# Patient Record
Sex: Female | Born: 1945 | Hispanic: No | Marital: Married | Smoking: Current some day smoker
Health system: Southern US, Community
[De-identification: ages and names within clinical notes are randomized; demographics above are authoritative.]

## PROBLEM LIST (undated history)

## (undated) DIAGNOSIS — E785 Hyperlipidemia, unspecified: Secondary | ICD-10-CM

## (undated) DIAGNOSIS — Z86010 Personal history of colonic polyps: Secondary | ICD-10-CM

## (undated) DIAGNOSIS — Z853 Personal history of malignant neoplasm of breast: Secondary | ICD-10-CM

## (undated) DIAGNOSIS — IMO0002 Reserved for concepts with insufficient information to code with codable children: Secondary | ICD-10-CM

## (undated) DIAGNOSIS — D4959 Neoplasm of unspecified behavior of other genitourinary organ: Secondary | ICD-10-CM

## (undated) DIAGNOSIS — I10 Essential (primary) hypertension: Secondary | ICD-10-CM

## (undated) DIAGNOSIS — H9190 Unspecified hearing loss, unspecified ear: Secondary | ICD-10-CM

## (undated) DIAGNOSIS — M1990 Unspecified osteoarthritis, unspecified site: Secondary | ICD-10-CM

## (undated) DIAGNOSIS — N289 Disorder of kidney and ureter, unspecified: Secondary | ICD-10-CM

## (undated) DIAGNOSIS — F319 Bipolar disorder, unspecified: Secondary | ICD-10-CM

## (undated) HISTORY — PX: BREAST LUMPECTOMY: SHX2

## (undated) HISTORY — DX: Reserved for concepts with insufficient information to code with codable children: IMO0002

## (undated) HISTORY — PX: REPLACEMENT TOTAL KNEE: SUR1224

## (undated) HISTORY — DX: Bipolar disorder, unspecified: F31.9

## (undated) HISTORY — DX: Essential (primary) hypertension: I10

## (undated) HISTORY — DX: Personal history of colonic polyps: Z86.010

## (undated) HISTORY — DX: Neoplasm of unspecified behavior of other genitourinary organ: D49.59

## (undated) HISTORY — DX: Unspecified hearing loss, unspecified ear: H91.90

## (undated) HISTORY — DX: Disorder of kidney and ureter, unspecified: N28.9

## (undated) HISTORY — DX: Unspecified osteoarthritis, unspecified site: M19.90

## (undated) HISTORY — DX: Hyperlipidemia, unspecified: E78.5

## (undated) HISTORY — DX: Personal history of malignant neoplasm of breast: Z85.3

---

## 2000-12-16 HISTORY — PX: UMBILICAL HERNIA REPAIR: SHX196

## 2004-05-16 HISTORY — PX: KNEE ARTHROSCOPY: SUR90

## 2004-08-16 HISTORY — PX: PELVIC FRACTURE SURGERY: SHX119

## 2004-09-25 ENCOUNTER — Ambulatory Visit: Payer: Self-pay

## 2004-10-16 DIAGNOSIS — D4959 Neoplasm of unspecified behavior of other genitourinary organ: Secondary | ICD-10-CM

## 2004-10-16 HISTORY — DX: Neoplasm of unspecified behavior of other genitourinary organ: D49.59

## 2004-11-02 ENCOUNTER — Other Ambulatory Visit: Payer: Self-pay

## 2004-11-09 ENCOUNTER — Ambulatory Visit: Payer: Self-pay | Admitting: Obstetrics & Gynecology

## 2004-12-05 ENCOUNTER — Ambulatory Visit: Payer: Self-pay | Admitting: Gynecologic Oncology

## 2005-05-01 ENCOUNTER — Ambulatory Visit: Payer: Self-pay | Admitting: Pain Medicine

## 2005-05-20 ENCOUNTER — Ambulatory Visit: Payer: Self-pay | Admitting: Pain Medicine

## 2005-05-22 ENCOUNTER — Ambulatory Visit: Payer: Self-pay | Admitting: Pain Medicine

## 2005-08-01 ENCOUNTER — Other Ambulatory Visit: Payer: Self-pay

## 2005-09-13 ENCOUNTER — Ambulatory Visit: Payer: Self-pay | Admitting: Cardiology

## 2005-09-20 ENCOUNTER — Ambulatory Visit: Payer: Self-pay | Admitting: Surgery

## 2005-10-03 ENCOUNTER — Ambulatory Visit: Payer: Self-pay | Admitting: Oncology

## 2005-10-17 ENCOUNTER — Ambulatory Visit: Payer: Self-pay | Admitting: Oncology

## 2005-11-15 ENCOUNTER — Ambulatory Visit: Payer: Self-pay | Admitting: Oncology

## 2005-12-16 ENCOUNTER — Ambulatory Visit: Payer: Self-pay | Admitting: Oncology

## 2006-01-16 ENCOUNTER — Ambulatory Visit: Payer: Self-pay | Admitting: Oncology

## 2006-04-15 ENCOUNTER — Ambulatory Visit: Payer: Self-pay | Admitting: Oncology

## 2006-05-16 ENCOUNTER — Ambulatory Visit: Payer: Self-pay | Admitting: Oncology

## 2006-06-20 ENCOUNTER — Ambulatory Visit: Payer: Self-pay | Admitting: Gastroenterology

## 2006-07-10 ENCOUNTER — Ambulatory Visit: Payer: Self-pay | Admitting: Internal Medicine

## 2006-07-16 ENCOUNTER — Ambulatory Visit: Payer: Self-pay | Admitting: Oncology

## 2006-08-11 ENCOUNTER — Encounter: Admission: RE | Admit: 2006-08-11 | Discharge: 2006-08-11 | Payer: Self-pay | Admitting: Neurosurgery

## 2006-08-16 ENCOUNTER — Ambulatory Visit: Payer: Self-pay | Admitting: Oncology

## 2006-09-09 ENCOUNTER — Ambulatory Visit: Payer: Self-pay | Admitting: Internal Medicine

## 2006-09-24 ENCOUNTER — Ambulatory Visit: Payer: Self-pay | Admitting: Internal Medicine

## 2006-10-28 ENCOUNTER — Ambulatory Visit: Payer: Self-pay | Admitting: Internal Medicine

## 2006-11-12 ENCOUNTER — Ambulatory Visit: Payer: Self-pay | Admitting: Oncology

## 2006-11-15 ENCOUNTER — Ambulatory Visit: Payer: Self-pay | Admitting: Oncology

## 2007-01-13 ENCOUNTER — Ambulatory Visit: Payer: Self-pay | Admitting: Internal Medicine

## 2007-01-20 ENCOUNTER — Ambulatory Visit: Payer: Self-pay | Admitting: Internal Medicine

## 2007-03-23 ENCOUNTER — Ambulatory Visit: Payer: Self-pay | Admitting: Oncology

## 2007-04-16 ENCOUNTER — Ambulatory Visit: Payer: Self-pay | Admitting: Oncology

## 2007-04-20 ENCOUNTER — Ambulatory Visit: Payer: Self-pay | Admitting: Internal Medicine

## 2007-04-20 DIAGNOSIS — E785 Hyperlipidemia, unspecified: Secondary | ICD-10-CM | POA: Insufficient documentation

## 2007-04-20 DIAGNOSIS — Z853 Personal history of malignant neoplasm of breast: Secondary | ICD-10-CM | POA: Insufficient documentation

## 2007-04-20 DIAGNOSIS — M5137 Other intervertebral disc degeneration, lumbosacral region: Secondary | ICD-10-CM | POA: Insufficient documentation

## 2007-04-20 DIAGNOSIS — M1990 Unspecified osteoarthritis, unspecified site: Secondary | ICD-10-CM | POA: Insufficient documentation

## 2007-04-20 DIAGNOSIS — I10 Essential (primary) hypertension: Secondary | ICD-10-CM | POA: Insufficient documentation

## 2007-04-20 DIAGNOSIS — Z860100 Personal history of colon polyps, unspecified: Secondary | ICD-10-CM | POA: Insufficient documentation

## 2007-04-20 DIAGNOSIS — N259 Disorder resulting from impaired renal tubular function, unspecified: Secondary | ICD-10-CM | POA: Insufficient documentation

## 2007-04-30 ENCOUNTER — Other Ambulatory Visit: Payer: Self-pay

## 2007-04-30 ENCOUNTER — Emergency Department: Payer: Self-pay | Admitting: Emergency Medicine

## 2007-05-03 ENCOUNTER — Emergency Department: Payer: Self-pay | Admitting: Emergency Medicine

## 2007-05-03 ENCOUNTER — Encounter: Payer: Self-pay | Admitting: Internal Medicine

## 2007-05-06 ENCOUNTER — Encounter: Payer: Self-pay | Admitting: Internal Medicine

## 2007-05-22 ENCOUNTER — Ambulatory Visit: Payer: Self-pay | Admitting: Internal Medicine

## 2007-05-22 LAB — CONVERTED CEMR LAB: INR: 2.1

## 2007-05-25 ENCOUNTER — Ambulatory Visit: Payer: Self-pay | Admitting: Internal Medicine

## 2007-06-01 ENCOUNTER — Ambulatory Visit: Payer: Self-pay | Admitting: Internal Medicine

## 2007-06-01 LAB — CONVERTED CEMR LAB: INR: 1.8

## 2007-06-15 ENCOUNTER — Ambulatory Visit: Payer: Self-pay | Admitting: Internal Medicine

## 2007-06-15 DIAGNOSIS — F319 Bipolar disorder, unspecified: Secondary | ICD-10-CM | POA: Insufficient documentation

## 2007-06-15 LAB — CONVERTED CEMR LAB
CO2: 34 meq/L — ABNORMAL HIGH (ref 19–32)
Glucose, Bld: 106 mg/dL — ABNORMAL HIGH (ref 70–99)
INR: 1.7
Phosphorus: 4.3 mg/dL (ref 2.3–4.6)
Potassium: 4.7 meq/L (ref 3.5–5.1)
Prothrombin Time: 16 s
Sodium: 146 meq/L — ABNORMAL HIGH (ref 135–145)

## 2007-06-29 ENCOUNTER — Ambulatory Visit: Payer: Self-pay | Admitting: Internal Medicine

## 2007-07-30 ENCOUNTER — Encounter (INDEPENDENT_AMBULATORY_CARE_PROVIDER_SITE_OTHER): Payer: Self-pay | Admitting: *Deleted

## 2007-08-03 ENCOUNTER — Ambulatory Visit: Payer: Self-pay | Admitting: Internal Medicine

## 2007-08-10 ENCOUNTER — Ambulatory Visit: Payer: Self-pay | Admitting: Internal Medicine

## 2007-08-19 ENCOUNTER — Ambulatory Visit: Payer: Self-pay | Admitting: Internal Medicine

## 2007-08-19 LAB — CONVERTED CEMR LAB
INR: 1.8
Prothrombin Time: 16.7 s

## 2007-09-09 ENCOUNTER — Telehealth: Payer: Self-pay | Admitting: Internal Medicine

## 2007-09-09 ENCOUNTER — Ambulatory Visit: Payer: Self-pay | Admitting: Internal Medicine

## 2007-09-09 LAB — CONVERTED CEMR LAB
INR: 2.8
Prothrombin Time: 20.4 s

## 2007-10-07 ENCOUNTER — Ambulatory Visit: Payer: Self-pay | Admitting: Internal Medicine

## 2007-10-07 LAB — CONVERTED CEMR LAB: Prothrombin Time: 19.9 s

## 2007-10-17 ENCOUNTER — Ambulatory Visit: Payer: Self-pay | Admitting: Oncology

## 2007-10-19 ENCOUNTER — Encounter: Payer: Self-pay | Admitting: Internal Medicine

## 2007-10-19 ENCOUNTER — Ambulatory Visit: Payer: Self-pay | Admitting: Oncology

## 2007-11-04 ENCOUNTER — Ambulatory Visit: Payer: Self-pay | Admitting: Internal Medicine

## 2007-11-04 LAB — CONVERTED CEMR LAB

## 2007-11-16 ENCOUNTER — Ambulatory Visit: Payer: Self-pay | Admitting: Oncology

## 2007-11-16 ENCOUNTER — Ambulatory Visit: Payer: Self-pay | Admitting: Internal Medicine

## 2007-11-17 LAB — CONVERTED CEMR LAB
CO2: 30 meq/L (ref 19–32)
Calcium: 9.8 mg/dL (ref 8.4–10.5)
Chloride: 107 meq/L (ref 96–112)
Chloride: 107 meq/L (ref 96–112)
Creatinine, Ser: 2 mg/dL — ABNORMAL HIGH (ref 0.4–1.2)
Creatinine, Ser: 2.1 mg/dL — ABNORMAL HIGH (ref 0.4–1.2)
GFR calc Af Amer: 33 mL/min
Glucose, Bld: 85 mg/dL (ref 70–99)
Glucose, Bld: 75 mg/dL (ref 70–99)
Phosphorus: 4.6 mg/dL (ref 2.3–4.6)
Sodium: 143 meq/L (ref 135–145)

## 2007-12-21 ENCOUNTER — Telehealth: Payer: Self-pay | Admitting: Internal Medicine

## 2008-02-14 ENCOUNTER — Ambulatory Visit: Payer: Self-pay | Admitting: Oncology

## 2008-03-15 ENCOUNTER — Ambulatory Visit: Payer: Self-pay | Admitting: Internal Medicine

## 2008-03-15 DIAGNOSIS — R32 Unspecified urinary incontinence: Secondary | ICD-10-CM | POA: Insufficient documentation

## 2008-03-16 LAB — CONVERTED CEMR LAB
Albumin: 3 g/dL — ABNORMAL LOW (ref 3.5–5.2)
Basophils Absolute: 0 10*3/uL (ref 0.0–0.1)
Basophils Relative: 0.4 % (ref 0.0–1.0)
CO2: 30 meq/L (ref 19–32)
Creatinine, Ser: 2.1 mg/dL — ABNORMAL HIGH (ref 0.4–1.2)
Glucose, Bld: 105 mg/dL — ABNORMAL HIGH (ref 70–99)
Hemoglobin: 12.8 g/dL (ref 12.0–15.0)
Lymphocytes Relative: 17.9 % (ref 12.0–46.0)
MCHC: 32.8 g/dL (ref 30.0–36.0)
Neutro Abs: 3.9 10*3/uL (ref 1.4–7.7)
RBC: 4.14 M/uL (ref 3.87–5.11)

## 2008-04-01 ENCOUNTER — Telehealth: Payer: Self-pay | Admitting: Internal Medicine

## 2008-04-12 ENCOUNTER — Encounter: Payer: Self-pay | Admitting: Internal Medicine

## 2008-04-13 ENCOUNTER — Encounter: Payer: Self-pay | Admitting: Internal Medicine

## 2008-04-15 ENCOUNTER — Ambulatory Visit: Payer: Self-pay | Admitting: Oncology

## 2008-04-20 ENCOUNTER — Ambulatory Visit: Payer: Self-pay | Admitting: Oncology

## 2008-05-16 ENCOUNTER — Ambulatory Visit: Payer: Self-pay | Admitting: Oncology

## 2008-05-30 ENCOUNTER — Telehealth: Payer: Self-pay | Admitting: Internal Medicine

## 2008-06-06 ENCOUNTER — Ambulatory Visit: Payer: Self-pay | Admitting: General Practice

## 2008-06-15 ENCOUNTER — Inpatient Hospital Stay: Payer: Self-pay | Admitting: General Practice

## 2008-06-21 ENCOUNTER — Encounter: Payer: Self-pay | Admitting: Internal Medicine

## 2008-07-06 ENCOUNTER — Ambulatory Visit: Payer: Self-pay | Admitting: Internal Medicine

## 2008-07-07 LAB — CONVERTED CEMR LAB
Albumin: 2.7 g/dL — ABNORMAL LOW (ref 3.5–5.2)
Basophils Absolute: 0 10*3/uL (ref 0.0–0.1)
Basophils Relative: 0.1 % (ref 0.0–3.0)
Creatinine, Ser: 2.5 mg/dL — ABNORMAL HIGH (ref 0.4–1.2)
Eosinophils Absolute: 0 10*3/uL (ref 0.0–0.7)
GFR calc Af Amer: 25 mL/min
HCT: 29 % — ABNORMAL LOW (ref 36.0–46.0)
Hemoglobin: 9.7 g/dL — ABNORMAL LOW (ref 12.0–15.0)
MCHC: 33.5 g/dL (ref 30.0–36.0)
MCV: 96 fL (ref 78.0–100.0)
Neutro Abs: 9.1 10*3/uL — ABNORMAL HIGH (ref 1.4–7.7)
Phosphorus: 4.3 mg/dL (ref 2.3–4.6)
RBC: 3.02 M/uL — ABNORMAL LOW (ref 3.87–5.11)

## 2008-08-31 ENCOUNTER — Ambulatory Visit: Payer: Self-pay | Admitting: Internal Medicine

## 2008-09-01 LAB — CONVERTED CEMR LAB
Basophils Absolute: 0 10*3/uL (ref 0.0–0.1)
Basophils Relative: 0.1 % (ref 0.0–3.0)
Calcium: 9.5 mg/dL (ref 8.4–10.5)
Chloride: 112 meq/L (ref 96–112)
Creatinine, Ser: 2.3 mg/dL — ABNORMAL HIGH (ref 0.4–1.2)
Eosinophils Absolute: 0 10*3/uL (ref 0.0–0.7)
GFR calc Af Amer: 28 mL/min
MCHC: 33.1 g/dL (ref 30.0–36.0)
MCV: 96.2 fL (ref 78.0–100.0)
Monocytes Absolute: 0.7 10*3/uL (ref 0.1–1.0)
Neutrophils Relative %: 75.1 % (ref 43.0–77.0)
Phosphorus: 3.1 mg/dL (ref 2.3–4.6)
Platelets: 227 10*3/uL (ref 150–400)
RBC: 3.39 M/uL — ABNORMAL LOW (ref 3.87–5.11)

## 2008-10-16 ENCOUNTER — Ambulatory Visit: Payer: Self-pay | Admitting: Oncology

## 2008-10-19 ENCOUNTER — Encounter: Payer: Self-pay | Admitting: Internal Medicine

## 2008-10-24 ENCOUNTER — Ambulatory Visit: Payer: Self-pay | Admitting: Oncology

## 2008-10-25 ENCOUNTER — Encounter: Payer: Self-pay | Admitting: Internal Medicine

## 2008-11-15 ENCOUNTER — Ambulatory Visit: Payer: Self-pay | Admitting: Oncology

## 2009-01-11 ENCOUNTER — Ambulatory Visit: Payer: Self-pay | Admitting: Internal Medicine

## 2009-01-11 DIAGNOSIS — L570 Actinic keratosis: Secondary | ICD-10-CM

## 2009-01-12 LAB — CONVERTED CEMR LAB
AST: 15 units/L (ref 0–37)
Alkaline Phosphatase: 81 units/L (ref 39–117)
Basophils Absolute: 0 10*3/uL (ref 0.0–0.1)
Basophils Relative: 0.4 % (ref 0.0–3.0)
Bilirubin, Direct: 0.1 mg/dL (ref 0.0–0.3)
CO2: 26 meq/L (ref 19–32)
Calcium: 9.4 mg/dL (ref 8.4–10.5)
Chloride: 113 meq/L — ABNORMAL HIGH (ref 96–112)
Creatinine, Ser: 2 mg/dL — ABNORMAL HIGH (ref 0.4–1.2)
Eosinophils Absolute: 0.1 10*3/uL (ref 0.0–0.7)
Glucose, Bld: 81 mg/dL (ref 70–99)
Lymphocytes Relative: 18.2 % (ref 12.0–46.0)
MCHC: 34.5 g/dL (ref 30.0–36.0)
Neutrophils Relative %: 68 % (ref 43.0–77.0)
Platelets: 143 10*3/uL — ABNORMAL LOW (ref 150–400)
RBC: 3.58 M/uL — ABNORMAL LOW (ref 3.87–5.11)
RDW: 14.8 % — ABNORMAL HIGH (ref 11.5–14.6)
Total Bilirubin: 0.5 mg/dL (ref 0.3–1.2)

## 2009-01-26 ENCOUNTER — Ambulatory Visit: Payer: Self-pay | Admitting: Internal Medicine

## 2009-03-01 ENCOUNTER — Telehealth: Payer: Self-pay | Admitting: Internal Medicine

## 2009-03-16 ENCOUNTER — Ambulatory Visit: Payer: Self-pay | Admitting: Oncology

## 2009-03-22 ENCOUNTER — Ambulatory Visit: Payer: Self-pay | Admitting: Internal Medicine

## 2009-04-11 ENCOUNTER — Encounter: Payer: Self-pay | Admitting: Internal Medicine

## 2009-04-11 ENCOUNTER — Ambulatory Visit: Payer: Self-pay | Admitting: Oncology

## 2009-04-15 ENCOUNTER — Ambulatory Visit: Payer: Self-pay | Admitting: Oncology

## 2009-07-13 ENCOUNTER — Telehealth (INDEPENDENT_AMBULATORY_CARE_PROVIDER_SITE_OTHER): Payer: Self-pay | Admitting: *Deleted

## 2009-07-19 ENCOUNTER — Ambulatory Visit: Payer: Self-pay | Admitting: Internal Medicine

## 2009-07-21 LAB — CONVERTED CEMR LAB
Albumin: 2.9 g/dL — ABNORMAL LOW (ref 3.5–5.2)
BUN: 26 mg/dL — ABNORMAL HIGH (ref 6–23)
CO2: 30 meq/L (ref 19–32)
Chloride: 110 meq/L (ref 96–112)
Eosinophils Relative: 1 % (ref 0.0–5.0)
HCT: 34.1 % — ABNORMAL LOW (ref 36.0–46.0)
Hemoglobin: 11.7 g/dL — ABNORMAL LOW (ref 12.0–15.0)
Lymphs Abs: 1 10*3/uL (ref 0.7–4.0)
MCV: 94.9 fL (ref 78.0–100.0)
Monocytes Absolute: 0.7 10*3/uL (ref 0.1–1.0)
Monocytes Relative: 12.2 % — ABNORMAL HIGH (ref 3.0–12.0)
Neutro Abs: 3.9 10*3/uL (ref 1.4–7.7)
Phosphorus: 4.2 mg/dL (ref 2.3–4.6)
Platelets: 167 10*3/uL (ref 150.0–400.0)
Potassium: 4.8 meq/L (ref 3.5–5.1)
RDW: 15.3 % — ABNORMAL HIGH (ref 11.5–14.6)
WBC: 5.7 10*3/uL (ref 4.5–10.5)

## 2009-09-01 ENCOUNTER — Encounter: Payer: Self-pay | Admitting: Internal Medicine

## 2009-09-11 ENCOUNTER — Encounter: Payer: Self-pay | Admitting: Internal Medicine

## 2009-09-11 ENCOUNTER — Ambulatory Visit: Payer: Self-pay | Admitting: Gastroenterology

## 2009-09-15 ENCOUNTER — Ambulatory Visit: Payer: Self-pay | Admitting: Oncology

## 2009-09-26 ENCOUNTER — Ambulatory Visit: Payer: Self-pay | Admitting: Oncology

## 2009-10-16 ENCOUNTER — Ambulatory Visit: Payer: Self-pay | Admitting: Oncology

## 2010-01-04 ENCOUNTER — Ambulatory Visit: Payer: Self-pay | Admitting: Internal Medicine

## 2010-01-17 ENCOUNTER — Ambulatory Visit: Payer: Self-pay | Admitting: Internal Medicine

## 2010-01-18 LAB — CONVERTED CEMR LAB
AST: 19 units/L (ref 0–37)
Alkaline Phosphatase: 76 units/L (ref 39–117)
BUN: 30 mg/dL — ABNORMAL HIGH (ref 6–23)
Basophils Absolute: 0 10*3/uL (ref 0.0–0.1)
Bilirubin, Direct: 0.1 mg/dL (ref 0.0–0.3)
CO2: 28 meq/L (ref 19–32)
Calcium: 9.4 mg/dL (ref 8.4–10.5)
Cholesterol: 210 mg/dL — ABNORMAL HIGH (ref 0–200)
Creatinine, Ser: 2.1 mg/dL — ABNORMAL HIGH (ref 0.4–1.2)
Eosinophils Absolute: 0 10*3/uL (ref 0.0–0.7)
Glucose, Bld: 103 mg/dL — ABNORMAL HIGH (ref 70–99)
HCT: 35.1 % — ABNORMAL LOW (ref 36.0–46.0)
Lymphs Abs: 1.2 10*3/uL (ref 0.7–4.0)
MCHC: 34 g/dL (ref 30.0–36.0)
MCV: 100 fL (ref 78.0–100.0)
Monocytes Absolute: 0.7 10*3/uL (ref 0.1–1.0)
Neutrophils Relative %: 68 % (ref 43.0–77.0)
Platelets: 154 10*3/uL (ref 150.0–400.0)
RDW: 14.1 % (ref 11.5–14.6)
Sodium: 141 meq/L (ref 135–145)
TSH: 1.15 microintl units/mL (ref 0.35–5.50)
Total Protein: 6 g/dL (ref 6.0–8.3)
VLDL: 32 mg/dL (ref 0.0–40.0)

## 2010-01-23 ENCOUNTER — Ambulatory Visit: Payer: Self-pay | Admitting: Internal Medicine

## 2010-01-23 ENCOUNTER — Encounter: Payer: Self-pay | Admitting: Internal Medicine

## 2010-02-01 ENCOUNTER — Ambulatory Visit: Payer: Self-pay | Admitting: Family Medicine

## 2010-02-02 ENCOUNTER — Encounter: Payer: Self-pay | Admitting: Family Medicine

## 2010-03-13 ENCOUNTER — Encounter: Payer: Self-pay | Admitting: Specialist

## 2010-03-16 ENCOUNTER — Ambulatory Visit: Payer: Self-pay | Admitting: Oncology

## 2010-03-16 ENCOUNTER — Encounter: Payer: Self-pay | Admitting: Specialist

## 2010-03-27 ENCOUNTER — Encounter: Payer: Self-pay | Admitting: Internal Medicine

## 2010-03-27 ENCOUNTER — Ambulatory Visit: Payer: Self-pay | Admitting: Oncology

## 2010-04-15 ENCOUNTER — Ambulatory Visit: Payer: Self-pay | Admitting: Oncology

## 2010-08-01 ENCOUNTER — Ambulatory Visit: Payer: Self-pay | Admitting: Internal Medicine

## 2010-08-01 DIAGNOSIS — F17200 Nicotine dependence, unspecified, uncomplicated: Secondary | ICD-10-CM

## 2010-08-01 DIAGNOSIS — R109 Unspecified abdominal pain: Secondary | ICD-10-CM | POA: Insufficient documentation

## 2010-08-03 LAB — CONVERTED CEMR LAB
Albumin: 3.1 g/dL — ABNORMAL LOW (ref 3.5–5.2)
BUN: 34 mg/dL — ABNORMAL HIGH (ref 6–23)
Chloride: 105 meq/L (ref 96–112)
Creatinine, Ser: 2.6 mg/dL — ABNORMAL HIGH (ref 0.4–1.2)
Phosphorus: 4.1 mg/dL (ref 2.3–4.6)

## 2010-08-08 ENCOUNTER — Encounter: Payer: Self-pay | Admitting: Internal Medicine

## 2010-08-29 ENCOUNTER — Encounter: Payer: Self-pay | Admitting: Internal Medicine

## 2010-08-29 ENCOUNTER — Ambulatory Visit: Payer: Self-pay | Admitting: Surgery

## 2010-08-30 ENCOUNTER — Telehealth: Payer: Self-pay | Admitting: Internal Medicine

## 2010-09-03 ENCOUNTER — Encounter: Payer: Self-pay | Admitting: Internal Medicine

## 2010-09-05 ENCOUNTER — Ambulatory Visit: Payer: Self-pay | Admitting: Surgery

## 2010-09-15 ENCOUNTER — Ambulatory Visit: Payer: Self-pay | Admitting: Oncology

## 2010-09-26 ENCOUNTER — Encounter: Payer: Self-pay | Admitting: Internal Medicine

## 2010-09-26 ENCOUNTER — Ambulatory Visit: Payer: Self-pay | Admitting: Oncology

## 2010-10-04 ENCOUNTER — Ambulatory Visit: Payer: Self-pay | Admitting: Internal Medicine

## 2010-10-05 LAB — CONVERTED CEMR LAB
Albumin: 3.2 g/dL — ABNORMAL LOW (ref 3.5–5.2)
BUN: 39 mg/dL — ABNORMAL HIGH (ref 6–23)
Calcium: 9.6 mg/dL (ref 8.4–10.5)
Chloride: 105 meq/L (ref 96–112)
Glucose, Bld: 84 mg/dL (ref 70–99)
Potassium: 4.2 meq/L (ref 3.5–5.1)

## 2010-10-08 ENCOUNTER — Encounter: Payer: Self-pay | Admitting: Internal Medicine

## 2010-10-16 ENCOUNTER — Ambulatory Visit: Payer: Self-pay | Admitting: Oncology

## 2010-10-24 ENCOUNTER — Ambulatory Visit: Payer: Self-pay | Admitting: Internal Medicine

## 2011-01-01 ENCOUNTER — Ambulatory Visit: Payer: Self-pay | Admitting: Otolaryngology

## 2011-01-01 ENCOUNTER — Encounter: Payer: Self-pay | Admitting: Internal Medicine

## 2011-01-08 ENCOUNTER — Encounter: Payer: Self-pay | Admitting: Internal Medicine
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
## 2011-01-15 NOTE — Letter (Signed)
Summary: Surgical Clearance/[facility]  Surgical [facility]   Imported By: Lanelle Bal 09/12/2010 11:16:29  _____________________________________________________________________  External Attachment:    Type:   Image     Comment:   External Document

## 2011-01-15 NOTE — Progress Notes (Signed)
Summary: Surgical Clearance  Phone Note Call from Patient   Caller: Dr.Smith @ [facility] [phone] Call For: Cindee Salt MD Summary of Call: pt is having surgery on 09/05/2010 for right & left breast mass, forms on your desk for surgical clearance. Does pt need to be seen? Pt was last seen 08/01/2010 for follow-up. Initial call taken by: Mervin Hack CMA Duncan Dull),  August 30, 2010 2:29 PM  Follow-up for Phone Call        needs appt to review any caridac issues and check an EKG Can add on Tuesday before lunch or with another physician if necessary  Follow-up by: Cindee Salt MD,  August 30, 2010 3:38 PM  Additional Follow-up for Phone Call Additional follow up Details #1::        Left message on machine at patients home number for her to return call.  Linde Gillis CMA Duncan Dull)  August 31, 2010 8:19 AM   Spoke with patient via telephone, she had an EKG done on 08/29/2010 at [facility] at her pre-op appt.  Do you still want her to schedule an appt to see you?  She says that Tuesday is not a good day for her to come in for a visit.  Understands that you will not be back in the office until Monday.  Please advise.  Linde Gillis CMA Duncan Dull)  August 31, 2010 9:59 AM   Actually, I just got the EKG---I can write a letter and not see her  Additional Follow-up by: Cindee Salt MD,  September 01, 2010 8:47 AM    Additional Follow-up for Phone Call Additional follow up Details #2::    Spoke with patient and advised results.  DeShannon Smith CMA Duncan Dull)  September 03, 2010 12:28 PM   letter done Please fax to Dr Truman Hayward Dia Crawford MD  September 03, 2010 2:04 PM   letter faxed to Dr.Smith's office  Follow-up by: Mervin Hack CMA Duncan Dull),  September 03, 2010 2:38 PM

## 2011-01-15 NOTE — Letter (Signed)
Summary: Generic Letter  [place] at [facility]  [address] [place], [place] [address]   Phone: [phone]  Fax: [phone]                  09/03/2010  Renda Rolls, MD [facility] [place], [place] [address]  Dear Deanna Mcintyre,  I have been the primary care physician for Deanna Mcintyre (DOB 09/02/1946) for a number of years. She has treated hypertension and fairly stable renal insuficiency.  I had seen her recently so did not call her back in the office. She has not had symptoms of ischemic heart disease and her EKG is benign. She has had chronic renal insufficiency with creat in the low 2's up to 2.5 or so. Her preop labs showed creat of 2.6 which is fairly stable.  I see no contraindications to the proposed excisions of bilateral breast masses--esp considering the low risk of the procedures, especially if under local anaesthesia with sedation.  If you need more information, please feel free to call.    Sincerely,      Tillman Abide MD

## 2011-01-15 NOTE — Consult Note (Signed)
Summary: [facility]  [facility]   Imported By: Lanelle Bal 08/13/2010 08:22:05  _____________________________________________________________________  External Attachment:    Type:   Image     Comment:   External Document

## 2011-01-15 NOTE — Letter (Signed)
Summary: [facility]  [facility]   Imported By: Maryln Gottron 10/22/2010 13:33:23  _____________________________________________________________________  External Attachment:    Type:   Image     Comment:   External Document  Appended [facility] femara for [facility] cancer

## 2011-01-15 NOTE — Letter (Signed)
Summary: [facility]  [facility]   Imported By: Lanelle Bal 08/22/2010 12:07:48  _____________________________________________________________________  External Attachment:    Type:   Image     Comment:   External Document  Appended Document: [facility] ultrasound guided left breast core biopsy of abnormal area on mammogram

## 2011-01-15 NOTE — Letter (Signed)
Summary: [facility]  [facility]   Imported By: Maryln Gottron 10/18/2010 14:41:21  _____________________________________________________________________  External Attachment:    Type:   Image     Comment:   External Document  Appended Document: [facility] recent breast biopsies just benign sclerosing adenosis repeat mammo next summer
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
## 2011-01-15 NOTE — Consult Note (Signed)
Summary: Dr.Janak Choksi,[facility],Note  Dr.Janak Choksi,[facility],Note   Imported By: Beau Fanny 03/28/2010 10:46:24  _____________________________________________________________________  External Attachment:    Type:   Image     Comment:   External Document  Appended Document: Dr.Janak Choksi,[facility],Note femara till 9/11 can consider occ topical estrogen after this creat 2.5----takes celebrex some
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
## 2011-01-15 NOTE — Consult Note (Signed)
Summary: Gwenlyn Fudge MD  Gwenlyn Fudge MD   Imported By: Lanelle Bal 02/09/2010 08:58:27  _____________________________________________________________________  External Attachment:    Type:   Image     Comment:   External Document

## 2011-01-31 NOTE — Letter (Signed)
Summary: [facility]   [facility]   Imported By: Kassie Mends 01/21/2011 10:37:37  _____________________________________________________________________  External Attachment:    Type:   Image     Comment:   External Document  Appended Document: [facility]  Hearing loss and ongoing balance issues MRI benign ??related to cymbalta which has renal clearance??

## 2011-02-06 ENCOUNTER — Ambulatory Visit (INDEPENDENT_AMBULATORY_CARE_PROVIDER_SITE_OTHER): Payer: BC Managed Care – PPO | Admitting: Internal Medicine

## 2011-02-06 ENCOUNTER — Encounter: Payer: Self-pay | Admitting: Internal Medicine

## 2011-02-06 DIAGNOSIS — I10 Essential (primary) hypertension: Secondary | ICD-10-CM

## 2011-02-06 DIAGNOSIS — N259 Disorder resulting from impaired renal tubular function, unspecified: Secondary | ICD-10-CM

## 2011-02-06 DIAGNOSIS — F319 Bipolar disorder, unspecified: Secondary | ICD-10-CM

## 2011-02-06 DIAGNOSIS — R109 Unspecified abdominal pain: Secondary | ICD-10-CM
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
## 2011-04-16 HISTORY — PX: JOINT REPLACEMENT: SHX530

## 2011-04-23 ENCOUNTER — Ambulatory Visit: Payer: Self-pay | Admitting: General Practice

## 2011-05-06 ENCOUNTER — Inpatient Hospital Stay: Payer: Self-pay | Admitting: General Practice

## 2011-05-10 ENCOUNTER — Encounter: Payer: Self-pay | Admitting: Internal Medicine

## 2011-05-17 ENCOUNTER — Encounter: Payer: Self-pay | Admitting: Internal Medicine

## 2011-05-29 ENCOUNTER — Ambulatory Visit: Payer: BC Managed Care – PPO | Admitting: Internal Medicine

## 2011-05-30 ENCOUNTER — Encounter: Payer: Self-pay | Admitting: Internal Medicine

## 2011-06-03 ENCOUNTER — Encounter: Payer: Self-pay | Admitting: Internal Medicine

## 2011-06-03 ENCOUNTER — Ambulatory Visit (INDEPENDENT_AMBULATORY_CARE_PROVIDER_SITE_OTHER)
Admission: RE | Admit: 2011-06-03 | Discharge: 2011-06-03 | Disposition: A | Discharge status: Home / Self Care | Payer: BC Managed Care – PPO | Source: Ambulatory Visit | Attending: Internal Medicine | Admitting: Internal Medicine

## 2011-06-03 ENCOUNTER — Ambulatory Visit (INDEPENDENT_AMBULATORY_CARE_PROVIDER_SITE_OTHER): Payer: BC Managed Care – PPO | Admitting: Internal Medicine

## 2011-06-03 VITALS — BP 140/80 | HR 80 | Temp 98.0°F | Ht 66.0 in | Wt 144.0 lb

## 2011-06-03 DIAGNOSIS — N259 Disorder resulting from impaired renal tubular function, unspecified: Secondary | ICD-10-CM

## 2011-06-03 DIAGNOSIS — M546 Pain in thoracic spine: Secondary | ICD-10-CM

## 2011-06-03 DIAGNOSIS — I10 Essential (primary) hypertension: Secondary | ICD-10-CM

## 2011-06-03 DIAGNOSIS — M549 Dorsalgia, unspecified: Secondary | ICD-10-CM

## 2011-06-03 DIAGNOSIS — F319 Bipolar disorder, unspecified: Secondary | ICD-10-CM

## 2011-06-03 NOTE — Assessment & Plan Note (Signed)
Since fall Probably just contusion Will check x-ray to exclude fracture in vertebrae

## 2011-06-03 NOTE — Assessment & Plan Note (Signed)
Stable on depakote and cymbalta Still sees Dr Grover Canavan

## 2011-06-03 NOTE — Assessment & Plan Note (Signed)
Now seeing nephrologist regularly She will ask for records to be sent

## 2011-06-03 NOTE — Assessment & Plan Note (Signed)
BP Readings from Last 3 Encounters:  06/03/11 140/80  02/06/11 134/82  10/24/10 138/80   Good control No changes Will try to get labs from nephrologist

## 2011-06-03 NOTE — Patient Instructions (Signed)
Please get the x-ray of your back done now
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
## 2011-06-16 ENCOUNTER — Encounter: Payer: Self-pay | Admitting: Internal Medicine

## 2011-07-17 ENCOUNTER — Encounter: Payer: Self-pay | Admitting: Internal Medicine

## 2011-09-25 ENCOUNTER — Encounter: Payer: Self-pay | Admitting: Internal Medicine

## 2011-09-25 ENCOUNTER — Ambulatory Visit (INDEPENDENT_AMBULATORY_CARE_PROVIDER_SITE_OTHER): Payer: BC Managed Care – PPO | Admitting: Internal Medicine

## 2011-09-25 DIAGNOSIS — I10 Essential (primary) hypertension: Secondary | ICD-10-CM

## 2011-09-25 DIAGNOSIS — N259 Disorder resulting from impaired renal tubular function, unspecified: Secondary | ICD-10-CM

## 2011-09-25 DIAGNOSIS — M1990 Unspecified osteoarthritis, unspecified site: Secondary | ICD-10-CM

## 2011-09-25 DIAGNOSIS — D179 Benign lipomatous neoplasm, unspecified: Secondary | ICD-10-CM

## 2011-09-25 DIAGNOSIS — R109 Unspecified abdominal pain: Secondary | ICD-10-CM

## 2011-09-25 DIAGNOSIS — F319 Bipolar disorder, unspecified: Secondary | ICD-10-CM

## 2011-09-25 LAB — CBC WITH DIFFERENTIAL/PLATELET
Basophils Relative: 0.3 % (ref 0.0–3.0)
Eosinophils Relative: 1.2 % (ref 0.0–5.0)
HCT: 36.4 % (ref 36.0–46.0)
Lymphs Abs: 1.7 10*3/uL (ref 0.7–4.0)
MCV: 97.5 fl (ref 78.0–100.0)
Monocytes Absolute: 0.5 10*3/uL (ref 0.1–1.0)
RBC: 3.73 Mil/uL — ABNORMAL LOW (ref 3.87–5.11)
WBC: 6.1 10*3/uL (ref 4.5–10.5)

## 2011-09-25 LAB — HEPATIC FUNCTION PANEL
ALT: 6 U/L (ref 0–35)
Albumin: 3.4 g/dL — ABNORMAL LOW (ref 3.5–5.2)
Total Protein: 6.8 g/dL (ref 6.0–8.3)

## 2011-09-25 NOTE — Assessment & Plan Note (Signed)
Stable  Will check labs on depakote

## 2011-09-25 NOTE — Assessment & Plan Note (Signed)
Sees Dr Judge Stall Awaiting her records
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
## 2011-09-25 NOTE — Assessment & Plan Note (Signed)
BP Readings from Last 3 Encounters:  09/25/11 139/82  06/03/11 140/80  02/06/11 134/82   Good control No changes

## 2011-09-25 NOTE — Assessment & Plan Note (Signed)
Ongoing pain issues No sig change Did have recent pelvic fracture

## 2011-09-25 NOTE — Patient Instructions (Signed)
Please stop the senna laxative and try miralax powder (polyethylene glycol)--1 capful daily for your bowels

## 2011-09-25 NOTE — Assessment & Plan Note (Addendum)
Long standing and persistent Not clearly IBS Failed hyoscamine Uses senekot-s for bowels---will switch to miralax in case it is causing gas

## 2011-09-25 NOTE — Assessment & Plan Note (Signed)
Discussed observation only

## 2011-09-26 LAB — VALPROIC ACID LEVEL: Valproic Acid Lvl: 77 ug/mL (ref 50.0–100.0)

## 2011-10-02 ENCOUNTER — Ambulatory Visit: Payer: Self-pay | Admitting: Oncology

## 2011-10-03 LAB — CANCER ANTIGEN 27.29: CA 27.29: 19.5 U/mL (ref 0.0–38.6)

## 2011-10-17 ENCOUNTER — Ambulatory Visit: Payer: Self-pay | Admitting: Oncology

## 2012-01-27 ENCOUNTER — Ambulatory Visit: Payer: BC Managed Care – PPO | Admitting: Internal Medicine

## 2012-04-03 ENCOUNTER — Encounter: Payer: Self-pay | Admitting: Family Medicine

## 2012-04-03 ENCOUNTER — Ambulatory Visit (INDEPENDENT_AMBULATORY_CARE_PROVIDER_SITE_OTHER): Payer: BC Managed Care – PPO | Admitting: Family Medicine

## 2012-04-03 VITALS — BP 140/80 | HR 68 | Temp 98.9°F | Wt 143.0 lb

## 2012-04-03 DIAGNOSIS — R300 Dysuria: Secondary | ICD-10-CM

## 2012-04-03 LAB — POCT URINALYSIS DIPSTICK
Leukocytes, UA: NEGATIVE
Urobilinogen, UA: NEGATIVE
pH, UA: 6

## 2012-04-03 MED ORDER — CIPROFLOXACIN HCL 500 MG PO TABS: 500.0000 mg | ORAL_TABLET | Freq: Two times a day (BID) | ORAL | Status: AC
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
## 2012-04-03 NOTE — Patient Instructions (Signed)
You do have a urinary tract infection. Take cipro as directed- 1 tablet twice daily for 3 days. We will call you next week to check on you and with your culture results.

## 2012-04-05 LAB — URINE CULTURE

## 2012-05-29 ENCOUNTER — Ambulatory Visit: Payer: Self-pay | Admitting: Oncology

## 2012-06-11 ENCOUNTER — Ambulatory Visit: Payer: Self-pay | Admitting: Gastroenterology

## 2012-06-15 ENCOUNTER — Ambulatory Visit: Payer: Self-pay | Admitting: Oncology

## 2012-06-15 LAB — PATHOLOGY REPORT

## 2012-07-16 ENCOUNTER — Ambulatory Visit: Payer: Self-pay | Admitting: Oncology

## 2013-01-06 ENCOUNTER — Ambulatory Visit: Payer: Self-pay | Admitting: Oncology

## 2013-01-08 ENCOUNTER — Ambulatory Visit: Payer: Self-pay | Admitting: Oncology

## 2013-01-14 ENCOUNTER — Encounter: Payer: Self-pay | Admitting: Physical Medicine and Rehabilitation

## 2013-01-16 ENCOUNTER — Ambulatory Visit: Payer: Self-pay | Admitting: Oncology

## 2013-01-16 ENCOUNTER — Encounter: Payer: Self-pay | Admitting: Physical Medicine and Rehabilitation

## 2013-02-05 LAB — BASIC METABOLIC PANEL
Anion Gap: 11 (ref 7–16)
Co2: 19 mmol/L — ABNORMAL LOW (ref 21–32)
Creatinine: 3.92 mg/dL — ABNORMAL HIGH (ref 0.60–1.30)
EGFR (African American): 13 — ABNORMAL LOW
EGFR (Non-African Amer.): 11 — ABNORMAL LOW
Glucose: 121 mg/dL — ABNORMAL HIGH (ref 65–99)
Osmolality: 295 (ref 275–301)
Potassium: 5.6 mmol/L — ABNORMAL HIGH (ref 3.5–5.1)

## 2013-02-05 LAB — CBC WITH DIFFERENTIAL/PLATELET
Basophil %: 0.3 %
Eosinophil %: 0 %
Lymphocyte #: 1.5 10*3/uL (ref 1.0–3.6)
MCH: 32.4 pg (ref 26.0–34.0)
Monocyte #: 1.3 x10 3/mm — ABNORMAL HIGH (ref 0.2–0.9)
Neutrophil #: 8.6 10*3/uL — ABNORMAL HIGH (ref 1.4–6.5)
WBC: 11.4 10*3/uL — ABNORMAL HIGH (ref 3.6–11.0)

## 2013-02-06 ENCOUNTER — Ambulatory Visit: Payer: Self-pay | Admitting: Orthopedic Surgery

## 2013-02-06 LAB — URINALYSIS, COMPLETE
Bilirubin,UR: NEGATIVE
Blood: NEGATIVE
Glucose,UR: NEGATIVE mg/dL (ref 0–75)
Nitrite: NEGATIVE
Protein: NEGATIVE
RBC,UR: <1 /HPF (ref 0–5)
WBC UR: 9 /HPF (ref 0–5)

## 2013-02-07 ENCOUNTER — Inpatient Hospital Stay: Payer: Self-pay | Admitting: Family Medicine

## 2013-02-07 LAB — BASIC METABOLIC PANEL
Anion Gap: 11 (ref 7–16)
Calcium, Total: 8.3 mg/dL — ABNORMAL LOW (ref 8.5–10.1)
Chloride: 105 mmol/L (ref 98–107)
Glucose: 128 mg/dL — ABNORMAL HIGH (ref 65–99)
Osmolality: 288 (ref 275–301)
Potassium: 5 mmol/L (ref 3.5–5.1)
Sodium: 132 mmol/L — ABNORMAL LOW (ref 136–145)

## 2013-02-07 LAB — CBC WITH DIFFERENTIAL/PLATELET
Platelet: 118 10*3/uL — ABNORMAL LOW (ref 150–440)
RBC: 1.58 10*6/uL — ABNORMAL LOW (ref 3.80–5.20)

## 2013-02-08 LAB — CBC WITH DIFFERENTIAL/PLATELET
Basophil %: 0.1 %
Eosinophil #: 0 10*3/uL (ref 0.0–0.7)
HCT: 22 % — ABNORMAL LOW (ref 35.0–47.0)
HGB: 7.6 g/dL — ABNORMAL LOW (ref 12.0–16.0)
Lymphocyte #: 1 10*3/uL (ref 1.0–3.6)
Monocyte #: 2.4 x10 3/mm — ABNORMAL HIGH (ref 0.2–0.9)
Monocyte %: 22.3 %
Neutrophil #: 7.4 10*3/uL — ABNORMAL HIGH (ref 1.4–6.5)
Neutrophil %: 68.3 %
Platelet: 85 10*3/uL — ABNORMAL LOW (ref 150–440)
RBC: 2.35 10*6/uL — ABNORMAL LOW (ref 3.80–5.20)
RDW: 17.5 % — ABNORMAL HIGH (ref 11.5–14.5)

## 2013-02-08 LAB — BASIC METABOLIC PANEL
Calcium, Total: 8.3 mg/dL — ABNORMAL LOW (ref 8.5–10.1)
Chloride: 116 mmol/L — ABNORMAL HIGH (ref 98–107)
Co2: 19 mmol/L — ABNORMAL LOW (ref 21–32)
EGFR (African American): 11 — ABNORMAL LOW
EGFR (African American): 11 — ABNORMAL LOW
EGFR (Non-African Amer.): 10 — ABNORMAL LOW
Glucose: 101 mg/dL — ABNORMAL HIGH (ref 65–99)
Glucose: 128 mg/dL — ABNORMAL HIGH (ref 65–99)
Osmolality: 309 (ref 275–301)

## 2013-02-09 LAB — CBC WITH DIFFERENTIAL/PLATELET
Basophil %: 0 %
Eosinophil %: 0 %
Lymphocyte #: 0.5 10*3/uL — ABNORMAL LOW (ref 1.0–3.6)
MCH: 32.4 pg (ref 26.0–34.0)
MCV: 95 fL (ref 80–100)
Monocyte %: 11.7 %
Neutrophil #: 6.9 10*3/uL — ABNORMAL HIGH (ref 1.4–6.5)
Neutrophil %: 82.6 %
RDW: 18.5 % — ABNORMAL HIGH (ref 11.5–14.5)

## 2013-02-09 LAB — CREATININE, SERUM: EGFR (African American): 12 — ABNORMAL LOW

## 2013-02-09 LAB — HEPATIC FUNCTION PANEL A (ARMC)
Albumin: 1.8 g/dL — ABNORMAL LOW (ref 3.4–5.0)
Alkaline Phosphatase: 44 U/L — ABNORMAL LOW (ref 50–136)
Bilirubin, Direct: <0.1 mg/dL (ref 0.00–0.20)
SGPT (ALT): 11 U/L — ABNORMAL LOW (ref 12–78)

## 2013-02-09 LAB — URINE CULTURE

## 2013-02-10 LAB — BASIC METABOLIC PANEL
BUN: 90 mg/dL — ABNORMAL HIGH (ref 7–18)
BUN: 94 mg/dL — ABNORMAL HIGH (ref 7–18)
Calcium, Total: 9.8 mg/dL (ref 8.5–10.1)
Calcium, Total: 9.4 mg/dL (ref 8.5–10.1)
Chloride: >128 mmol/L — ABNORMAL HIGH (ref 98–107)
Co2: 22 mmol/L (ref 21–32)
Creatinine: 4.37 mg/dL — ABNORMAL HIGH (ref 0.60–1.30)
EGFR (African American): 12 — ABNORMAL LOW
EGFR (African American): 12 — ABNORMAL LOW
EGFR (Non-African Amer.): 10 — ABNORMAL LOW
EGFR (Non-African Amer.): 10 — ABNORMAL LOW
Glucose: 127 mg/dL — ABNORMAL HIGH (ref 65–99)
Potassium: 5.1 mmol/L (ref 3.5–5.1)
Sodium: >160 mmol/L (ref 136–145)

## 2013-02-10 LAB — SODIUM
Sodium: >160 mmol/L (ref 136–145)
Sodium: >160 mmol/L (ref 136–145)

## 2013-02-10 LAB — TSH: Thyroid Stimulating Horm: 0.685 u[IU]/mL

## 2013-02-10 LAB — PLATELET COUNT: Platelet: 124 10*3/uL — ABNORMAL LOW (ref 150–440)

## 2013-02-11 LAB — BASIC METABOLIC PANEL
BUN: 91 mg/dL — ABNORMAL HIGH (ref 7–18)
BUN: 90 mg/dL — ABNORMAL HIGH (ref 7–18)
Calcium, Total: 9.3 mg/dL (ref 8.5–10.1)
Chloride: >128 mmol/L — ABNORMAL HIGH (ref 98–107)
Chloride: >128 mmol/L — ABNORMAL HIGH (ref 98–107)
Co2: 21 mmol/L (ref 21–32)
Creatinine: 4.2 mg/dL — ABNORMAL HIGH (ref 0.60–1.30)
EGFR (African American): 12 — ABNORMAL LOW
EGFR (Non-African Amer.): 10 — ABNORMAL LOW
EGFR (Non-African Amer.): 10 — ABNORMAL LOW
Glucose: 123 mg/dL — ABNORMAL HIGH (ref 65–99)
Glucose: 138 mg/dL — ABNORMAL HIGH (ref 65–99)
Osmolality: 346 (ref 275–301)
Potassium: 5.1 mmol/L (ref 3.5–5.1)
Sodium: 160 mmol/L (ref 136–145)

## 2013-02-11 LAB — SODIUM
Sodium: 146 mmol/L — ABNORMAL HIGH (ref 136–145)
Sodium: 151 mmol/L — ABNORMAL HIGH (ref 136–145)
Sodium: 152 mmol/L — ABNORMAL HIGH (ref 136–145)
Sodium: 156 mmol/L — ABNORMAL HIGH (ref 136–145)
Sodium: 156 mmol/L — ABNORMAL HIGH (ref 136–145)

## 2013-02-12 LAB — SODIUM
Sodium: 144 mmol/L (ref 136–145)
Sodium: 141 mmol/L (ref 136–145)

## 2013-02-12 LAB — BASIC METABOLIC PANEL
Calcium, Total: 9.1 mg/dL (ref 8.5–10.1)
Co2: 23 mmol/L (ref 21–32)
Glucose: 116 mg/dL — ABNORMAL HIGH (ref 65–99)
Osmolality: 322 (ref 275–301)
Sodium: 150 mmol/L — ABNORMAL HIGH (ref 136–145)

## 2013-02-12 LAB — CBC WITH DIFFERENTIAL/PLATELET
Basophil %: 0.3 %
Eosinophil #: 0 10*3/uL (ref 0.0–0.7)
Eosinophil %: 1.1 %
HCT: 20.9 % — ABNORMAL LOW (ref 35.0–47.0)
HGB: 6.8 g/dL — ABNORMAL LOW (ref 12.0–16.0)
MCHC: 32.6 g/dL (ref 32.0–36.0)
MCV: 98 fL (ref 80–100)
Monocyte #: 0.7 x10 3/mm (ref 0.2–0.9)
Monocyte %: 17.6 %
Neutrophil %: 54.5 %
RDW: 16.8 % — ABNORMAL HIGH (ref 11.5–14.5)
WBC: 4.2 10*3/uL (ref 3.6–11.0)

## 2013-02-13 LAB — BASIC METABOLIC PANEL
BUN: 74 mg/dL — ABNORMAL HIGH (ref 7–18)
Chloride: 111 mmol/L — ABNORMAL HIGH (ref 98–107)
Co2: 23 mmol/L (ref 21–32)
Creatinine: 3.01 mg/dL — ABNORMAL HIGH (ref 0.60–1.30)
Glucose: 270 mg/dL — ABNORMAL HIGH (ref 65–99)
Osmolality: 313 (ref 275–301)
Sodium: 141 mmol/L (ref 136–145)

## 2013-02-13 LAB — HEMOGLOBIN: HGB: 9.3 g/dL — ABNORMAL LOW (ref 12.0–16.0)

## 2013-02-14 LAB — SODIUM, URINE, RANDOM: Sodium, Urine Random: 40 mmol/L (ref 20–110)

## 2013-02-14 LAB — URINALYSIS, COMPLETE
Bilirubin,UR: NEGATIVE
Glucose,UR: 50 mg/dL (ref 0–75)
Ketone: NEGATIVE
Ph: 6 (ref 4.5–8.0)
Protein: NEGATIVE
RBC,UR: 2 /HPF (ref 0–5)
Squamous Epithelial: <1

## 2013-02-14 LAB — BASIC METABOLIC PANEL
Anion Gap: 5 — ABNORMAL LOW (ref 7–16)
BUN: 78 mg/dL — ABNORMAL HIGH (ref 7–18)
Calcium, Total: 9.1 mg/dL (ref 8.5–10.1)
Chloride: 116 mmol/L — ABNORMAL HIGH (ref 98–107)
Co2: 25 mmol/L (ref 21–32)
Co2: 23 mmol/L (ref 21–32)
Creatinine: 3.02 mg/dL — ABNORMAL HIGH (ref 0.60–1.30)
Creatinine: 3 mg/dL — ABNORMAL HIGH (ref 0.60–1.30)
EGFR (African American): 18 — ABNORMAL LOW
Glucose: 123 mg/dL — ABNORMAL HIGH (ref 65–99)
Osmolality: 323 (ref 275–301)
Potassium: 5.4 mmol/L — ABNORMAL HIGH (ref 3.5–5.1)
Sodium: 150 mmol/L — ABNORMAL HIGH (ref 136–145)

## 2013-02-14 LAB — AMMONIA: Ammonia, Plasma: 31 mcmol/L (ref 11–32)

## 2013-02-15 LAB — BASIC METABOLIC PANEL
Anion Gap: 6 — ABNORMAL LOW (ref 7–16)
BUN: 79 mg/dL — ABNORMAL HIGH (ref 7–18)
Calcium, Total: 9.3 mg/dL (ref 8.5–10.1)
Chloride: 116 mmol/L — ABNORMAL HIGH (ref 98–107)
Creatinine: 3.15 mg/dL — ABNORMAL HIGH (ref 0.60–1.30)
EGFR (African American): 17 — ABNORMAL LOW
Osmolality: 313 (ref 275–301)
Potassium: 5.1 mmol/L (ref 3.5–5.1)
Sodium: 145 mmol/L (ref 136–145)

## 2013-02-15 LAB — CBC WITH DIFFERENTIAL/PLATELET
Basophil #: 0.1 10*3/uL
Basophil %: 0.7 %
Eosinophil #: 0.1 10*3/uL
Eosinophil %: 1.2 %
HCT: 27.5 % — ABNORMAL LOW
HGB: 8.8 g/dL — ABNORMAL LOW
Lymphocyte %: 13.3 %
Lymphs Abs: 1.2 10*3/uL
MCH: 30.3 pg
MCHC: 32.1 g/dL
MCV: 94 fL
Monocyte #: 1.5 10*3/uL — ABNORMAL HIGH
Monocyte %: 16.9 %
Neutrophil #: 5.9 10*3/uL
Neutrophil %: 67.9 %
Platelet: 248 10*3/uL
RBC: 2.91 X10 6/mm 3 — ABNORMAL LOW
RDW: 16.6 % — ABNORMAL HIGH
WBC: 8.6 10*3/uL

## 2013-02-15 LAB — CULTURE, BLOOD (SINGLE)

## 2013-02-15 LAB — OSMOLALITY, URINE
Osmolality: 225 mOsm/kg
Osmolality: 214 mosm/kg

## 2013-02-16 LAB — BASIC METABOLIC PANEL
BUN: 80 mg/dL — ABNORMAL HIGH (ref 7–18)
Calcium, Total: 9.1 mg/dL (ref 8.5–10.1)
Chloride: 115 mmol/L — ABNORMAL HIGH (ref 98–107)
Glucose: 95 mg/dL (ref 65–99)
Osmolality: 313 (ref 275–301)
Potassium: 4.5 mmol/L (ref 3.5–5.1)
Sodium: 145 mmol/L (ref 136–145)

## 2013-02-16 LAB — ALBUMIN: Albumin: 1.5 g/dL — ABNORMAL LOW (ref 3.4–5.0)

## 2013-02-16 LAB — PHOSPHORUS: Phosphorus: 5.3 mg/dL — ABNORMAL HIGH (ref 2.5–4.9)

## 2013-02-17 LAB — BASIC METABOLIC PANEL
BUN: 79 mg/dL — ABNORMAL HIGH (ref 7–18)
Calcium, Total: 9.1 mg/dL (ref 8.5–10.1)
Chloride: 113 mmol/L — ABNORMAL HIGH (ref 98–107)
Co2: 25 mmol/L (ref 21–32)
EGFR (African American): 19 — ABNORMAL LOW
EGFR (Non-African Amer.): 16 — ABNORMAL LOW
Sodium: 142 mmol/L (ref 136–145)

## 2013-02-18 ENCOUNTER — Institutional Professional Consult (permissible substitution)
Admission: AD | Admit: 2013-02-18 | Discharge: 2013-03-24 | Disposition: A | Discharge status: Skilled Nursing Facility | Payer: Self-pay | Source: Ambulatory Visit | Attending: Internal Medicine | Admitting: Internal Medicine

## 2013-02-18 LAB — CBC WITH DIFFERENTIAL/PLATELET
Bands: 3 %
Basophil: 1 %
Eosinophil: 1 %
Lymphocytes: 16 %
MCHC: 32.8 g/dL (ref 32.0–36.0)
MCV: 95 fL (ref 80–100)
Monocytes: 13 %
Platelet: 305 10*3/uL (ref 150–440)
RBC: 2.65 10*6/uL — ABNORMAL LOW (ref 3.80–5.20)
RDW: 16.5 % — ABNORMAL HIGH (ref 11.5–14.5)

## 2013-02-18 LAB — BASIC METABOLIC PANEL
Anion Gap: 2 — ABNORMAL LOW (ref 7–16)
BUN: 73 mg/dL — ABNORMAL HIGH (ref 7–18)
Calcium, Total: 9 mg/dL (ref 8.5–10.1)
Chloride: 115 mmol/L — ABNORMAL HIGH (ref 98–107)
Co2: 28 mmol/L (ref 21–32)
EGFR (Non-African Amer.): 17 — ABNORMAL LOW
Glucose: 247 mg/dL — ABNORMAL HIGH (ref 65–99)
Osmolality: 318 (ref 275–301)
Potassium: 4.4 mmol/L (ref 3.5–5.1)
Sodium: 145 mmol/L (ref 136–145)

## 2013-02-19 ENCOUNTER — Other Ambulatory Visit (HOSPITAL_COMMUNITY): Payer: Self-pay

## 2013-02-19 LAB — COMPREHENSIVE METABOLIC PANEL
ALT: 21 U/L (ref 0–35)
Alkaline Phosphatase: 73 U/L (ref 39–117)
CO2: 24 mEq/L (ref 19–32)
Chloride: 109 mEq/L (ref 96–112)
GFR calc Af Amer: 22 mL/min — ABNORMAL LOW (ref >90)
GFR calc non Af Amer: 19 mL/min — ABNORMAL LOW (ref >90)
Glucose, Bld: 102 mg/dL — ABNORMAL HIGH (ref 70–99)
Potassium: 4.9 mEq/L (ref 3.5–5.1)
Sodium: 142 mEq/L (ref 135–145)
Total Bilirubin: 0.1 mg/dL — ABNORMAL LOW (ref 0.3–1.2)

## 2013-02-19 LAB — URINALYSIS, ROUTINE W REFLEX MICROSCOPIC
Bilirubin Urine: NEGATIVE
Glucose, UA: NEGATIVE mg/dL
Ketones, ur: NEGATIVE mg/dL
Protein, ur: NEGATIVE mg/dL

## 2013-02-19 LAB — CBC
HCT: 27 % — ABNORMAL LOW (ref 36.0–46.0)
Hemoglobin: 8.8 g/dL — ABNORMAL LOW (ref 12.0–15.0)
RBC: 2.85 MIL/uL — ABNORMAL LOW (ref 3.87–5.11)

## 2013-02-19 LAB — URINE MICROSCOPIC-ADD ON

## 2013-02-20 LAB — CBC WITH DIFFERENTIAL/PLATELET
Basophils Absolute: 0 10*3/uL (ref 0.0–0.1)
HCT: 25 % — ABNORMAL LOW (ref 36.0–46.0)
Lymphocytes Relative: 23 % (ref 12–46)
Monocytes Absolute: 0.6 10*3/uL (ref 0.1–1.0)
Neutro Abs: 5.4 10*3/uL (ref 1.7–7.7)
RBC: 2.63 MIL/uL — ABNORMAL LOW (ref 3.87–5.11)
RDW: 16.3 % — ABNORMAL HIGH (ref 11.5–15.5)
WBC: 8 10*3/uL (ref 4.0–10.5)

## 2013-02-20 LAB — CULTURE, BLOOD (SINGLE)

## 2013-02-20 LAB — BASIC METABOLIC PANEL
CO2: 26 mEq/L (ref 19–32)
Chloride: 110 mEq/L (ref 96–112)
Sodium: 142 mEq/L (ref 135–145)

## 2013-02-22 LAB — DIFFERENTIAL
Eosinophils Relative: 1 % (ref 0–5)
Lymphocytes Relative: 13 % (ref 12–46)
Lymphs Abs: 1 10*3/uL (ref 0.7–4.0)
Monocytes Absolute: 0.7 10*3/uL (ref 0.1–1.0)

## 2013-02-22 LAB — CBC
HCT: 26 % — ABNORMAL LOW (ref 36.0–46.0)
MCV: 94.9 fL (ref 78.0–100.0)
RBC: 2.74 MIL/uL — ABNORMAL LOW (ref 3.87–5.11)
WBC: 8.1 10*3/uL (ref 4.0–10.5)

## 2013-02-22 LAB — BASIC METABOLIC PANEL
BUN: 74 mg/dL — ABNORMAL HIGH (ref 6–23)
CO2: 24 mEq/L (ref 19–32)
Chloride: 107 mEq/L (ref 96–112)
Creatinine, Ser: 2.53 mg/dL — ABNORMAL HIGH (ref 0.50–1.10)

## 2013-02-23 DIAGNOSIS — M7989 Other specified soft tissue disorders: Secondary | ICD-10-CM

## 2013-02-23 LAB — URINALYSIS, ROUTINE W REFLEX MICROSCOPIC
Bilirubin Urine: NEGATIVE
Glucose, UA: NEGATIVE mg/dL
Nitrite: NEGATIVE
Specific Gravity, Urine: 1.007 (ref 1.005–1.030)
pH: 6.5 (ref 5.0–8.0)

## 2013-02-23 LAB — COMPREHENSIVE METABOLIC PANEL
ALT: 9 U/L (ref 0–35)
AST: 12 U/L (ref 0–37)
Albumin: 1.6 g/dL — ABNORMAL LOW (ref 3.5–5.2)
Alkaline Phosphatase: 84 U/L (ref 39–117)
CO2: 25 mEq/L (ref 19–32)
Chloride: 107 mEq/L (ref 96–112)
Potassium: 4.4 mEq/L (ref 3.5–5.1)
Total Bilirubin: 0.2 mg/dL — ABNORMAL LOW (ref 0.3–1.2)

## 2013-02-23 LAB — CBC
HCT: 25.9 % — ABNORMAL LOW (ref 36.0–46.0)
Platelets: 374 10*3/uL (ref 150–400)
RBC: 2.73 MIL/uL — ABNORMAL LOW (ref 3.87–5.11)
RDW: 16.4 % — ABNORMAL HIGH (ref 11.5–15.5)
WBC: 4.9 10*3/uL (ref 4.0–10.5)

## 2013-02-23 LAB — URINE MICROSCOPIC-ADD ON

## 2013-02-23 NOTE — Progress Notes (Signed)
VASCULAR LAB PRELIMINARY  PRELIMINARY  PRELIMINARY  PRELIMINARY  Right upper extremity venous duplex completed.    Preliminary report:  Right:  No obvious evidence of DVT or superficial thrombosis.  Unable to image the subclavian vein due to IV hardware and dressings.  CESTONE, Deanna Mcintyre, RVT 02/23/2013, 9:38 AM

## 2013-02-24 LAB — BASIC METABOLIC PANEL
BUN: 64 mg/dL — ABNORMAL HIGH (ref 6–23)
CO2: 25 mEq/L (ref 19–32)
Chloride: 106 mEq/L (ref 96–112)
GFR calc Af Amer: 26 mL/min — ABNORMAL LOW (ref >90)
Potassium: 4.3 mEq/L (ref 3.5–5.1)

## 2013-02-25 ENCOUNTER — Other Ambulatory Visit (HOSPITAL_COMMUNITY): Payer: Self-pay

## 2013-02-25 LAB — BASIC METABOLIC PANEL
BUN: 62 mg/dL — ABNORMAL HIGH (ref 6–23)
Chloride: 106 mEq/L (ref 96–112)
GFR calc non Af Amer: 22 mL/min — ABNORMAL LOW (ref >90)
Glucose, Bld: 81 mg/dL (ref 70–99)
Potassium: 4.9 mEq/L (ref 3.5–5.1)

## 2013-02-25 LAB — URINE CULTURE
Colony Count: >=100000
Special Requests: NORMAL

## 2013-02-25 LAB — CBC
HCT: 28.1 % — ABNORMAL LOW (ref 36.0–46.0)
Hemoglobin: 9.1 g/dL — ABNORMAL LOW (ref 12.0–15.0)
MCHC: 32.4 g/dL (ref 30.0–36.0)
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
## 2013-02-26 ENCOUNTER — Other Ambulatory Visit (HOSPITAL_COMMUNITY): Payer: Self-pay

## 2013-02-26 LAB — BASIC METABOLIC PANEL
BUN: 60 mg/dL — ABNORMAL HIGH (ref 6–23)
CO2: 27 mEq/L (ref 19–32)
Chloride: 104 mEq/L (ref 96–112)
Creatinine, Ser: 2.25 mg/dL — ABNORMAL HIGH (ref 0.50–1.10)

## 2013-02-27 LAB — CBC
MCH: 30.2 pg (ref 26.0–34.0)
MCV: 93.5 fL (ref 78.0–100.0)
Platelets: 353 10*3/uL (ref 150–400)
RDW: 16.3 % — ABNORMAL HIGH (ref 11.5–15.5)

## 2013-02-27 LAB — BASIC METABOLIC PANEL
BUN: 57 mg/dL — ABNORMAL HIGH (ref 6–23)
CO2: 31 mEq/L (ref 19–32)
Calcium: 10.3 mg/dL (ref 8.4–10.5)
Calcium: 9.3 mg/dL (ref 8.4–10.5)
Creatinine, Ser: 2.43 mg/dL — ABNORMAL HIGH (ref 0.50–1.10)
GFR calc non Af Amer: 20 mL/min — ABNORMAL LOW (ref >90)
Glucose, Bld: 90 mg/dL (ref 70–99)
Glucose, Bld: 90 mg/dL (ref 70–99)
Potassium: 4.2 mEq/L (ref 3.5–5.1)

## 2013-02-28 ENCOUNTER — Other Ambulatory Visit (HOSPITAL_COMMUNITY): Payer: Self-pay

## 2013-02-28 LAB — BASIC METABOLIC PANEL
BUN: 56 mg/dL — ABNORMAL HIGH (ref 6–23)
CO2: 31 mEq/L (ref 19–32)
GFR calc non Af Amer: 20 mL/min — ABNORMAL LOW (ref >90)
Glucose, Bld: 89 mg/dL (ref 70–99)
Potassium: 3.9 mEq/L (ref 3.5–5.1)

## 2013-03-01 LAB — CBC WITH DIFFERENTIAL/PLATELET
Basophils Absolute: 0 10*3/uL (ref 0.0–0.1)
Eosinophils Relative: 1 % (ref 0–5)
HCT: 27 % — ABNORMAL LOW (ref 36.0–46.0)
Lymphocytes Relative: 30 % (ref 12–46)
Lymphs Abs: 1.7 10*3/uL (ref 0.7–4.0)
MCV: 91.8 fL (ref 78.0–100.0)
Monocytes Absolute: 0.6 10*3/uL (ref 0.1–1.0)
Neutro Abs: 3.2 10*3/uL (ref 1.7–7.7)
RBC: 2.94 MIL/uL — ABNORMAL LOW (ref 3.87–5.11)
RDW: 16.3 % — ABNORMAL HIGH (ref 11.5–15.5)
WBC: 5.6 10*3/uL (ref 4.0–10.5)

## 2013-03-01 LAB — COMPREHENSIVE METABOLIC PANEL
ALT: 5 U/L (ref 0–35)
AST: 10 U/L (ref 0–37)
CO2: 30 mEq/L (ref 19–32)
Calcium: 9.7 mg/dL (ref 8.4–10.5)
Chloride: 105 mEq/L (ref 96–112)
Creatinine, Ser: 2.55 mg/dL — ABNORMAL HIGH (ref 0.50–1.10)
GFR calc Af Amer: 22 mL/min — ABNORMAL LOW (ref >90)
GFR calc non Af Amer: 19 mL/min — ABNORMAL LOW (ref >90)
Glucose, Bld: 82 mg/dL (ref 70–99)
Sodium: 142 mEq/L (ref 135–145)
Total Bilirubin: 0.2 mg/dL — ABNORMAL LOW (ref 0.3–1.2)

## 2013-03-02 ENCOUNTER — Other Ambulatory Visit (HOSPITAL_COMMUNITY): Payer: Self-pay

## 2013-03-02 LAB — BASIC METABOLIC PANEL
BUN: 43 mg/dL — ABNORMAL HIGH (ref 6–23)
Calcium: 6.7 mg/dL — ABNORMAL LOW (ref 8.4–10.5)
Creatinine, Ser: 1.87 mg/dL — ABNORMAL HIGH (ref 0.50–1.10)
GFR calc Af Amer: 31 mL/min — ABNORMAL LOW (ref >90)
GFR calc non Af Amer: 27 mL/min — ABNORMAL LOW (ref >90)

## 2013-03-03 ENCOUNTER — Other Ambulatory Visit (HOSPITAL_COMMUNITY): Payer: Self-pay

## 2013-03-03 LAB — POTASSIUM: Potassium: 6.1 mEq/L — ABNORMAL HIGH (ref 3.5–5.1)

## 2013-03-04 ENCOUNTER — Other Ambulatory Visit (HOSPITAL_COMMUNITY): Payer: Self-pay

## 2013-03-04 LAB — CBC WITH DIFFERENTIAL/PLATELET
Basophils Absolute: 0 10*3/uL (ref 0.0–0.1)
Basophils Relative: 0 % (ref 0–1)
Eosinophils Absolute: 0 10*3/uL (ref 0.0–0.7)
Eosinophils Relative: 0 % (ref 0–5)
Lymphs Abs: 1.3 10*3/uL (ref 0.7–4.0)
MCH: 30.9 pg (ref 26.0–34.0)
MCHC: 33.3 g/dL (ref 30.0–36.0)
MCV: 92.6 fL (ref 78.0–100.0)
Neutrophils Relative %: 75 % (ref 43–77)
Platelets: 343 10*3/uL (ref 150–400)
RDW: 17 % — ABNORMAL HIGH (ref 11.5–15.5)

## 2013-03-04 LAB — BASIC METABOLIC PANEL
Calcium: 10.5 mg/dL (ref 8.4–10.5)
GFR calc Af Amer: 16 mL/min — ABNORMAL LOW (ref >90)
GFR calc non Af Amer: 14 mL/min — ABNORMAL LOW (ref >90)
Glucose, Bld: 136 mg/dL — ABNORMAL HIGH (ref 70–99)
Potassium: 4.6 mEq/L (ref 3.5–5.1)
Sodium: 143 mEq/L (ref 135–145)

## 2013-03-05 LAB — CBC
MCH: 30.3 pg (ref 26.0–34.0)
MCHC: 31.9 g/dL (ref 30.0–36.0)
Platelets: 296 10*3/uL (ref 150–400)
RBC: 3.2 MIL/uL — ABNORMAL LOW (ref 3.87–5.11)

## 2013-03-05 LAB — BASIC METABOLIC PANEL
BUN: 73 mg/dL — ABNORMAL HIGH (ref 6–23)
GFR calc Af Amer: 16 mL/min — ABNORMAL LOW (ref >90)
GFR calc non Af Amer: 13 mL/min — ABNORMAL LOW (ref >90)
Potassium: 5.4 mEq/L — ABNORMAL HIGH (ref 3.5–5.1)

## 2013-03-05 LAB — MAGNESIUM
Magnesium: 4.8 mg/dL — ABNORMAL HIGH (ref 1.5–2.5)
Magnesium: 4.6 mg/dL — ABNORMAL HIGH (ref 1.5–2.5)

## 2013-03-06 ENCOUNTER — Other Ambulatory Visit (HOSPITAL_COMMUNITY): Payer: Self-pay

## 2013-03-06 LAB — MAGNESIUM: Magnesium: 4 mg/dL — ABNORMAL HIGH (ref 1.5–2.5)

## 2013-03-06 LAB — BASIC METABOLIC PANEL
Calcium: 9.2 mg/dL (ref 8.4–10.5)
Chloride: 95 mEq/L — ABNORMAL LOW (ref 96–112)
Creatinine, Ser: 3.3 mg/dL — ABNORMAL HIGH (ref 0.50–1.10)
GFR calc Af Amer: 16 mL/min — ABNORMAL LOW (ref >90)
Sodium: 135 mEq/L (ref 135–145)

## 2013-03-07 LAB — BASIC METABOLIC PANEL
BUN: 60 mg/dL — ABNORMAL HIGH (ref 6–23)
Creatinine, Ser: 3.01 mg/dL — ABNORMAL HIGH (ref 0.50–1.10)
GFR calc Af Amer: 18 mL/min — ABNORMAL LOW (ref >90)
GFR calc non Af Amer: 15 mL/min — ABNORMAL LOW (ref >90)

## 2013-03-08 ENCOUNTER — Other Ambulatory Visit (HOSPITAL_COMMUNITY): Payer: Self-pay

## 2013-03-08 LAB — BASIC METABOLIC PANEL
BUN: 64 mg/dL — ABNORMAL HIGH (ref 6–23)
Calcium: 9.2 mg/dL (ref 8.4–10.5)
Creatinine, Ser: 3.03 mg/dL — ABNORMAL HIGH (ref 0.50–1.10)
GFR calc Af Amer: 18 mL/min — ABNORMAL LOW (ref >90)
GFR calc non Af Amer: 15 mL/min — ABNORMAL LOW (ref >90)

## 2013-03-08 LAB — CBC
HCT: 24 % — ABNORMAL LOW (ref 36.0–46.0)
Hemoglobin: 8.2 g/dL — ABNORMAL LOW (ref 12.0–15.0)
MCH: 31.7 pg (ref 26.0–34.0)
MCHC: 34.2 g/dL (ref 30.0–36.0)
MCV: 92.7 fL (ref 78.0–100.0)
Platelets: 214 10*3/uL (ref 150–400)
RBC: 2.59 MIL/uL — ABNORMAL LOW (ref 3.87–5.11)
RDW: 16.8 % — ABNORMAL HIGH (ref 11.5–15.5)
WBC: 5 10*3/uL (ref 4.0–10.5)

## 2013-03-08 LAB — MAGNESIUM: Magnesium: 3.3 mg/dL — ABNORMAL HIGH (ref 1.5–2.5)

## 2013-03-09 LAB — CBC
HCT: 24 % — ABNORMAL LOW (ref 36.0–46.0)
MCV: 92 fL (ref 78.0–100.0)
RDW: 17.1 % — ABNORMAL HIGH (ref 11.5–15.5)
WBC: 5.6 10*3/uL (ref 4.0–10.5)

## 2013-03-09 LAB — URINALYSIS, ROUTINE W REFLEX MICROSCOPIC
Bilirubin Urine: NEGATIVE
Glucose, UA: NEGATIVE mg/dL
Ketones, ur: NEGATIVE mg/dL
pH: 7.5 (ref 5.0–8.0)

## 2013-03-09 LAB — BASIC METABOLIC PANEL
BUN: 62 mg/dL — ABNORMAL HIGH (ref 6–23)
CO2: 27 mEq/L (ref 19–32)
Chloride: 104 mEq/L (ref 96–112)
Creatinine, Ser: 2.8 mg/dL — ABNORMAL HIGH (ref 0.50–1.10)
GFR calc Af Amer: 19 mL/min — ABNORMAL LOW (ref >90)

## 2013-03-10 ENCOUNTER — Ambulatory Visit: Payer: BC Managed Care – PPO | Admitting: Internal Medicine

## 2013-03-10 LAB — BASIC METABOLIC PANEL
Calcium: 9.9 mg/dL (ref 8.4–10.5)
Creatinine, Ser: 2.54 mg/dL — ABNORMAL HIGH (ref 0.50–1.10)
GFR calc Af Amer: 22 mL/min — ABNORMAL LOW (ref >90)

## 2013-03-10 LAB — CBC
Platelets: 221 10*3/uL (ref 150–400)
RDW: 17.5 % — ABNORMAL HIGH (ref 11.5–15.5)
WBC: 6.7 10*3/uL (ref 4.0–10.5)

## 2013-03-11 LAB — BASIC METABOLIC PANEL
CO2: 27 mEq/L (ref 19–32)
Calcium: 9.5 mg/dL (ref 8.4–10.5)
Chloride: 107 mEq/L (ref 96–112)
GFR calc Af Amer: 24 mL/min — ABNORMAL LOW (ref >90)
Sodium: 139 mEq/L (ref 135–145)

## 2013-03-11 LAB — CBC
MCH: 30.9 pg (ref 26.0–34.0)
Platelets: 179 10*3/uL (ref 150–400)
RBC: 2.46 MIL/uL — ABNORMAL LOW (ref 3.87–5.11)
WBC: 6.3 10*3/uL (ref 4.0–10.5)

## 2013-03-12 LAB — CBC WITH DIFFERENTIAL/PLATELET
Hemoglobin: 8 g/dL — ABNORMAL LOW (ref 12.0–15.0)
Lymphocytes Relative: 27 % (ref 12–46)
Lymphs Abs: 1.5 10*3/uL (ref 0.7–4.0)
MCH: 30.7 pg (ref 26.0–34.0)
Monocytes Relative: 12 % (ref 3–12)
Neutro Abs: 3.3 10*3/uL (ref 1.7–7.7)
Neutrophils Relative %: 60 % (ref 43–77)
RBC: 2.61 MIL/uL — ABNORMAL LOW (ref 3.87–5.11)
WBC: 5.5 10*3/uL (ref 4.0–10.5)

## 2013-03-12 LAB — URINE CULTURE: Colony Count: >=100000

## 2013-03-12 LAB — BASIC METABOLIC PANEL
GFR calc Af Amer: 24 mL/min — ABNORMAL LOW (ref >90)
GFR calc non Af Amer: 20 mL/min — ABNORMAL LOW (ref >90)
Glucose, Bld: 231 mg/dL — ABNORMAL HIGH (ref 70–99)
Potassium: 4.2 mEq/L (ref 3.5–5.1)
Sodium: 136 mEq/L (ref 135–145)

## 2013-03-14 LAB — BASIC METABOLIC PANEL
BUN: 53 mg/dL — ABNORMAL HIGH (ref 6–23)
CO2: 23 mEq/L (ref 19–32)
Chloride: 103 mEq/L (ref 96–112)
Glucose, Bld: 101 mg/dL — ABNORMAL HIGH (ref 70–99)
Potassium: 4.3 mEq/L (ref 3.5–5.1)
Sodium: 136 mEq/L (ref 135–145)

## 2013-03-14 LAB — CBC
HCT: 23.5 % — ABNORMAL LOW (ref 36.0–46.0)
Hemoglobin: 7.8 g/dL — ABNORMAL LOW (ref 12.0–15.0)
MCH: 31.1 pg (ref 26.0–34.0)
MCHC: 33.2 g/dL (ref 30.0–36.0)
RBC: 2.51 MIL/uL — ABNORMAL LOW (ref 3.87–5.11)

## 2013-03-15 LAB — BASIC METABOLIC PANEL
BUN: 56 mg/dL — ABNORMAL HIGH (ref 6–23)
CO2: 25 mEq/L (ref 19–32)
Calcium: 10.8 mg/dL — ABNORMAL HIGH (ref 8.4–10.5)
GFR calc non Af Amer: 19 mL/min — ABNORMAL LOW (ref >90)
Glucose, Bld: 91 mg/dL (ref 70–99)

## 2013-03-15 LAB — CBC WITH DIFFERENTIAL/PLATELET
Basophils Relative: 0 % (ref 0–1)
Eosinophils Absolute: 0 10*3/uL (ref 0.0–0.7)
Eosinophils Relative: 0 % (ref 0–5)
Hemoglobin: 8.1 g/dL — ABNORMAL LOW (ref 12.0–15.0)
Lymphs Abs: 1.6 10*3/uL (ref 0.7–4.0)
MCH: 31 pg (ref 26.0–34.0)
MCHC: 32.7 g/dL (ref 30.0–36.0)
MCV: 95 fL (ref 78.0–100.0)
Monocytes Relative: 8 % (ref 3–12)
Neutrophils Relative %: 65 % (ref 43–77)
Platelets: 190 10*3/uL (ref 150–400)
RBC: 2.61 MIL/uL — ABNORMAL LOW (ref 3.87–5.11)

## 2013-03-16 LAB — BASIC METABOLIC PANEL
BUN: 61 mg/dL — ABNORMAL HIGH (ref 6–23)
Calcium: 11 mg/dL — ABNORMAL HIGH (ref 8.4–10.5)
GFR calc Af Amer: 22 mL/min — ABNORMAL LOW (ref >90)
GFR calc non Af Amer: 19 mL/min — ABNORMAL LOW (ref >90)
Glucose, Bld: 101 mg/dL — ABNORMAL HIGH (ref 70–99)
Potassium: 5 mEq/L (ref 3.5–5.1)

## 2013-03-17 LAB — BASIC METABOLIC PANEL
BUN: 62 mg/dL — ABNORMAL HIGH (ref 6–23)
Calcium: 10.7 mg/dL — ABNORMAL HIGH (ref 8.4–10.5)
Creatinine, Ser: 2.45 mg/dL — ABNORMAL HIGH (ref 0.50–1.10)
GFR calc Af Amer: 23 mL/min — ABNORMAL LOW (ref >90)
GFR calc non Af Amer: 20 mL/min — ABNORMAL LOW (ref >90)

## 2013-03-17 LAB — MAGNESIUM: Magnesium: 2.3 mg/dL (ref 1.5–2.5)

## 2013-03-18 LAB — CALCIUM, IONIZED: Calcium, Ion: 1.62 mmol/L — ABNORMAL HIGH (ref 1.13–1.30)

## 2013-03-18 LAB — CBC
HCT: 24.9 % — ABNORMAL LOW (ref 36.0–46.0)
RBC: 2.7 MIL/uL — ABNORMAL LOW (ref 3.87–5.11)
RDW: 18.4 % — ABNORMAL HIGH (ref 11.5–15.5)
WBC: 6.8 10*3/uL (ref 4.0–10.5)

## 2013-03-18 LAB — COMPREHENSIVE METABOLIC PANEL
AST: 8 U/L (ref 0–37)
Albumin: 2.1 g/dL — ABNORMAL LOW (ref 3.5–5.2)
BUN: 66 mg/dL — ABNORMAL HIGH (ref 6–23)
Calcium: 10.9 mg/dL — ABNORMAL HIGH (ref 8.4–10.5)
Chloride: 108 mEq/L (ref 96–112)
Creatinine, Ser: 2.48 mg/dL — ABNORMAL HIGH (ref 0.50–1.10)
Total Bilirubin: 0.1 mg/dL — ABNORMAL LOW (ref 0.3–1.2)
Total Protein: 5.3 g/dL — ABNORMAL LOW (ref 6.0–8.3)

## 2013-03-19 ENCOUNTER — Other Ambulatory Visit (HOSPITAL_COMMUNITY): Payer: Self-pay

## 2013-03-19 LAB — CBC WITH DIFFERENTIAL/PLATELET
HCT: 24.6 % — ABNORMAL LOW (ref 36.0–46.0)
Hemoglobin: 8.2 g/dL — ABNORMAL LOW (ref 12.0–15.0)
Lymphocytes Relative: 27 % (ref 12–46)
MCHC: 33.3 g/dL (ref 30.0–36.0)
MCV: 93.2 fL (ref 78.0–100.0)
Monocytes Absolute: 0.6 10*3/uL (ref 0.1–1.0)
Monocytes Relative: 10 % (ref 3–12)
Neutro Abs: 3.7 10*3/uL (ref 1.7–7.7)
WBC: 6 10*3/uL (ref 4.0–10.5)

## 2013-03-19 LAB — BASIC METABOLIC PANEL
BUN: 69 mg/dL — ABNORMAL HIGH (ref 6–23)
CO2: 23 mEq/L (ref 19–32)
Chloride: 109 mEq/L (ref 96–112)
GFR calc Af Amer: 21 mL/min — ABNORMAL LOW (ref >90)
Potassium: 4.6 mEq/L (ref 3.5–5.1)

## 2013-03-20 LAB — COMPREHENSIVE METABOLIC PANEL
ALT: <5 U/L (ref 0–35)
CO2: 24 mEq/L (ref 19–32)
Calcium: 10.7 mg/dL — ABNORMAL HIGH (ref 8.4–10.5)
Chloride: 111 mEq/L (ref 96–112)
Creatinine, Ser: 2.46 mg/dL — ABNORMAL HIGH (ref 0.50–1.10)
GFR calc Af Amer: 23 mL/min — ABNORMAL LOW (ref >90)
GFR calc non Af Amer: 19 mL/min — ABNORMAL LOW (ref >90)
Glucose, Bld: 91 mg/dL (ref 70–99)
Total Bilirubin: 0.1 mg/dL — ABNORMAL LOW (ref 0.3–1.2)

## 2013-03-21 LAB — CBC
Hemoglobin: 8.4 g/dL — ABNORMAL LOW (ref 12.0–15.0)
MCH: 31.2 pg (ref 26.0–34.0)
MCHC: 33.5 g/dL (ref 30.0–36.0)
Platelets: 198 10*3/uL (ref 150–400)
RBC: 2.69 MIL/uL — ABNORMAL LOW (ref 3.87–5.11)

## 2013-03-21 LAB — BASIC METABOLIC PANEL
Calcium: 10.7 mg/dL — ABNORMAL HIGH (ref 8.4–10.5)
GFR calc non Af Amer: 19 mL/min — ABNORMAL LOW (ref >90)
Glucose, Bld: 87 mg/dL (ref 70–99)
Potassium: 4.6 mEq/L (ref 3.5–5.1)
Sodium: 143 mEq/L (ref 135–145)

## 2013-03-22 ENCOUNTER — Other Ambulatory Visit (HOSPITAL_COMMUNITY): Payer: Self-pay

## 2013-03-22 LAB — BASIC METABOLIC PANEL
BUN: 63 mg/dL — ABNORMAL HIGH (ref 6–23)
Chloride: 103 mEq/L (ref 96–112)
GFR calc Af Amer: 23 mL/min — ABNORMAL LOW (ref >90)
GFR calc non Af Amer: 20 mL/min — ABNORMAL LOW (ref >90)
Potassium: 4.3 mEq/L (ref 3.5–5.1)
Sodium: 135 mEq/L (ref 135–145)

## 2013-03-23 LAB — BASIC METABOLIC PANEL
BUN: 67 mg/dL — ABNORMAL HIGH (ref 6–23)
CO2: 24 mEq/L (ref 19–32)
Chloride: 101 mEq/L (ref 96–112)
GFR calc non Af Amer: 18 mL/min — ABNORMAL LOW (ref >90)
Glucose, Bld: 179 mg/dL — ABNORMAL HIGH (ref 70–99)
Potassium: 4.4 mEq/L (ref 3.5–5.1)
Sodium: 133 mEq/L — ABNORMAL LOW (ref 135–145)

## 2013-03-23 LAB — CBC
HCT: 23.6 % — ABNORMAL LOW (ref 36.0–46.0)
Hemoglobin: 8 g/dL — ABNORMAL LOW (ref 12.0–15.0)
RBC: 2.56 MIL/uL — ABNORMAL LOW (ref 3.87–5.11)
WBC: 7.6 10*3/uL (ref 4.0–10.5)

## 2013-03-24 LAB — BASIC METABOLIC PANEL
BUN: 62 mg/dL — ABNORMAL HIGH (ref 6–23)
Chloride: 105 mEq/L (ref 96–112)
GFR calc non Af Amer: 19 mL/min — ABNORMAL LOW (ref >90)
Glucose, Bld: 186 mg/dL — ABNORMAL HIGH (ref 70–99)
Potassium: 4.4 mEq/L (ref 3.5–5.1)
Sodium: 136 mEq/L (ref 135–145)

## 2013-04-12 ENCOUNTER — Inpatient Hospital Stay: Payer: Self-pay | Admitting: Internal Medicine

## 2013-04-12 LAB — COMPREHENSIVE METABOLIC PANEL
Albumin: 2.5 g/dL — ABNORMAL LOW (ref 3.4–5.0)
Alkaline Phosphatase: 74 U/L (ref 50–136)
Anion Gap: 10 (ref 7–16)
Bilirubin,Total: 0.2 mg/dL (ref 0.2–1.0)
Calcium, Total: 10.1 mg/dL (ref 8.5–10.1)
Co2: 23 mmol/L (ref 21–32)
Creatinine: 3.25 mg/dL — ABNORMAL HIGH (ref 0.60–1.30)
EGFR (African American): 16 — ABNORMAL LOW
EGFR (Non-African Amer.): 14 — ABNORMAL LOW
Glucose: 86 mg/dL (ref 65–99)
Osmolality: 336 (ref 275–301)
Potassium: 4.9 mmol/L (ref 3.5–5.1)
Sodium: 158 mmol/L — ABNORMAL HIGH (ref 136–145)
Total Protein: 6 g/dL — ABNORMAL LOW (ref 6.4–8.2)

## 2013-04-12 LAB — URINALYSIS, COMPLETE
Glucose,UR: NEGATIVE mg/dL (ref 0–75)
Ketone: NEGATIVE
Nitrite: NEGATIVE
Specific Gravity: 1.009 (ref 1.003–1.030)
Squamous Epithelial: NONE SEEN

## 2013-04-12 LAB — TROPONIN I: Troponin-I: 0.04 ng/mL

## 2013-04-12 LAB — CBC
HGB: 9.5 g/dL — ABNORMAL LOW (ref 12.0–16.0)
MCH: 33.1 pg (ref 26.0–34.0)
MCHC: 33.2 g/dL (ref 32.0–36.0)
MCV: 100 fL (ref 80–100)
Platelet: 218 10*3/uL (ref 150–440)
RBC: 2.86 10*6/uL — ABNORMAL LOW (ref 3.80–5.20)
WBC: 5.4 10*3/uL (ref 3.6–11.0)

## 2013-04-12 LAB — VALPROIC ACID LEVEL: Valproic Acid: 61 ug/mL

## 2013-04-13 LAB — BASIC METABOLIC PANEL
Anion Gap: 6 — ABNORMAL LOW (ref 7–16)
Calcium, Total: 9.7 mg/dL (ref 8.5–10.1)
Chloride: 124 mmol/L — ABNORMAL HIGH (ref 98–107)
Co2: 24 mmol/L (ref 21–32)
Creatinine: 2.85 mg/dL — ABNORMAL HIGH (ref 0.60–1.30)
EGFR (African American): 19 — ABNORMAL LOW
EGFR (Non-African Amer.): 17 — ABNORMAL LOW
Glucose: 113 mg/dL — ABNORMAL HIGH (ref 65–99)
Osmolality: 327 (ref 275–301)
Potassium: 4.5 mmol/L (ref 3.5–5.1)
Sodium: 154 mmol/L — ABNORMAL HIGH (ref 136–145)

## 2013-04-13 LAB — MAGNESIUM: Magnesium: 1.9 mg/dL

## 2013-04-14 LAB — BASIC METABOLIC PANEL
Anion Gap: 9 (ref 7–16)
Calcium, Total: 9.8 mg/dL (ref 8.5–10.1)
Creatinine: 2.52 mg/dL — ABNORMAL HIGH (ref 0.60–1.30)
EGFR (Non-African Amer.): 19 — ABNORMAL LOW
Potassium: 5.3 mmol/L — ABNORMAL HIGH (ref 3.5–5.1)

## 2013-04-15 LAB — BASIC METABOLIC PANEL
Anion Gap: 7 (ref 7–16)
BUN: 58 mg/dL — ABNORMAL HIGH (ref 7–18)
Calcium, Total: 9.8 mg/dL (ref 8.5–10.1)
Chloride: 120 mmol/L — ABNORMAL HIGH (ref 98–107)
Chloride: 116 mmol/L — ABNORMAL HIGH (ref 98–107)
Co2: 20 mmol/L — ABNORMAL LOW (ref 21–32)
Co2: 26 mmol/L (ref 21–32)
EGFR (African American): 24 — ABNORMAL LOW
EGFR (Non-African Amer.): 20 — ABNORMAL LOW
EGFR (Non-African Amer.): 21 — ABNORMAL LOW
Osmolality: 309 (ref 275–301)
Sodium: 147 mmol/L — ABNORMAL HIGH (ref 136–145)
Sodium: 146 mmol/L — ABNORMAL HIGH (ref 136–145)

## 2013-04-16 LAB — BASIC METABOLIC PANEL
Anion Gap: 5 — ABNORMAL LOW (ref 7–16)
Calcium, Total: 10 mg/dL (ref 8.5–10.1)
Chloride: 118 mmol/L — ABNORMAL HIGH (ref 98–107)
Co2: 26 mmol/L (ref 21–32)
Creatinine: 2.32 mg/dL — ABNORMAL HIGH (ref 0.60–1.30)
EGFR (African American): 25 — ABNORMAL LOW
EGFR (Non-African Amer.): 21 — ABNORMAL LOW
Osmolality: 312 (ref 275–301)

## 2013-04-16 LAB — HEMOGLOBIN: HGB: 7.7 g/dL — ABNORMAL LOW (ref 12.0–16.0)

## 2013-04-17 LAB — BASIC METABOLIC PANEL
Anion Gap: 5 — ABNORMAL LOW (ref 7–16)
Calcium, Total: 10.1 mg/dL (ref 8.5–10.1)
Co2: 25 mmol/L (ref 21–32)
EGFR (African American): 23 — ABNORMAL LOW
Glucose: 84 mg/dL (ref 65–99)
Sodium: 142 mmol/L (ref 136–145)

## 2013-04-17 LAB — IRON AND TIBC
Iron Saturation: 33 %
Iron: 89 ug/dL (ref 50–170)
Unbound Iron-Bind.Cap.: 177 ug/dL

## 2013-04-18 LAB — CBC WITH DIFFERENTIAL/PLATELET
Bands: 2 %
MCH: 32.5 pg (ref 26.0–34.0)
MCHC: 33.4 g/dL (ref 32.0–36.0)
MCV: 98 fL (ref 80–100)
Metamyelocyte: 4 %
NRBC/100 WBC: 1 /
Platelet: 204 10*3/uL (ref 150–440)
RDW: 18.9 % — ABNORMAL HIGH (ref 11.5–14.5)
Segmented Neutrophils: 48 %
Variant Lymphocyte - H1-Rlymph: 1 %

## 2013-04-18 LAB — BASIC METABOLIC PANEL
Anion Gap: 6 — ABNORMAL LOW (ref 7–16)
BUN: 63 mg/dL — ABNORMAL HIGH (ref 7–18)
Chloride: 109 mmol/L — ABNORMAL HIGH (ref 98–107)
Co2: 25 mmol/L (ref 21–32)
Creatinine: 2.5 mg/dL — ABNORMAL HIGH (ref 0.60–1.30)
Glucose: 69 mg/dL (ref 65–99)

## 2013-04-19 LAB — BASIC METABOLIC PANEL
Calcium, Total: 9.7 mg/dL (ref 8.5–10.1)
Chloride: 109 mmol/L — ABNORMAL HIGH (ref 98–107)
Co2: 23 mmol/L (ref 21–32)
EGFR (African American): 22 — ABNORMAL LOW
EGFR (Non-African Amer.): 19 — ABNORMAL LOW
Glucose: 82 mg/dL (ref 65–99)

## 2013-04-22 ENCOUNTER — Ambulatory Visit: Payer: Medicare Other | Admitting: Internal Medicine

## 2013-05-16 ENCOUNTER — Ambulatory Visit: Payer: Self-pay | Admitting: Internal Medicine

## 2013-06-02 ENCOUNTER — Emergency Department: Payer: Self-pay

## 2013-06-02 LAB — COMPREHENSIVE METABOLIC PANEL
Alkaline Phosphatase: 69 U/L (ref 50–136)
Anion Gap: 8 (ref 7–16)
Bilirubin,Total: 0.2 mg/dL (ref 0.2–1.0)
Chloride: 116 mmol/L — ABNORMAL HIGH (ref 98–107)
Co2: 22 mmol/L (ref 21–32)
Creatinine: 3.08 mg/dL — ABNORMAL HIGH (ref 0.60–1.30)
EGFR (African American): 18 — ABNORMAL LOW
EGFR (Non-African Amer.): 15 — ABNORMAL LOW
Osmolality: 317 (ref 275–301)
SGOT(AST): 10 U/L — ABNORMAL LOW (ref 15–37)
SGPT (ALT): 9 U/L — ABNORMAL LOW (ref 12–78)
Sodium: 146 mmol/L — ABNORMAL HIGH (ref 136–145)
Total Protein: 6.1 g/dL — ABNORMAL LOW (ref 6.4–8.2)

## 2013-06-02 LAB — CBC
HCT: 22.4 % — ABNORMAL LOW (ref 35.0–47.0)
HGB: 7.6 g/dL — ABNORMAL LOW (ref 12.0–16.0)
MCHC: 34 g/dL (ref 32.0–36.0)
Platelet: 188 10*3/uL (ref 150–440)
RBC: 2.22 10*6/uL — ABNORMAL LOW (ref 3.80–5.20)
RDW: 16.6 % — ABNORMAL HIGH (ref 11.5–14.5)
WBC: 9.6 10*3/uL (ref 3.6–11.0)

## 2013-06-09 ENCOUNTER — Inpatient Hospital Stay: Payer: Self-pay | Admitting: Internal Medicine

## 2013-06-09 LAB — URINALYSIS, COMPLETE
Bilirubin,UR: NEGATIVE
Nitrite: NEGATIVE
Ph: 6 (ref 4.5–8.0)
RBC,UR: 4 /HPF (ref 0–5)
Specific Gravity: 1.006 (ref 1.003–1.030)
WBC UR: 10 /HPF (ref 0–5)

## 2013-06-09 LAB — CBC
HCT: 21.3 % — ABNORMAL LOW (ref 35.0–47.0)
HGB: 6.8 g/dL — ABNORMAL LOW (ref 12.0–16.0)
MCH: 32.9 pg (ref 26.0–34.0)
MCHC: 32 g/dL (ref 32.0–36.0)
MCV: 103 fL — ABNORMAL HIGH (ref 80–100)
Platelet: 266 10*3/uL (ref 150–440)

## 2013-06-09 LAB — PROTIME-INR: Prothrombin Time: 14.8 secs — ABNORMAL HIGH (ref 11.5–14.7)

## 2013-06-09 LAB — COMPREHENSIVE METABOLIC PANEL
Albumin: 1.8 g/dL — ABNORMAL LOW (ref 3.4–5.0)
Alkaline Phosphatase: 57 U/L (ref 50–136)
Anion Gap: 12 (ref 7–16)
Bilirubin,Total: 0.1 mg/dL — ABNORMAL LOW (ref 0.2–1.0)
Chloride: 125 mmol/L — ABNORMAL HIGH (ref 98–107)
Co2: 17 mmol/L — ABNORMAL LOW (ref 21–32)
Creatinine: 3.52 mg/dL — ABNORMAL HIGH (ref 0.60–1.30)
EGFR (African American): 15 — ABNORMAL LOW
EGFR (Non-African Amer.): 13 — ABNORMAL LOW
Glucose: 96 mg/dL (ref 65–99)
SGPT (ALT): 9 U/L — ABNORMAL LOW (ref 12–78)
Sodium: 154 mmol/L — ABNORMAL HIGH (ref 136–145)
Total Protein: 5.9 g/dL — ABNORMAL LOW (ref 6.4–8.2)

## 2013-06-09 LAB — CSF CELL COUNT WITH DIFFERENTIAL
CSF Tube #: 3
Lymphocytes: 0 %
Other Cells: 0 %
RBC (CSF): 55 /mm3
WBC (CSF): 0 /mm3

## 2013-06-09 LAB — VALPROIC ACID LEVEL: Valproic Acid: 69 ug/mL

## 2013-06-09 LAB — MAGNESIUM: Magnesium: 2 mg/dL

## 2013-06-09 LAB — GLUCOSE, CSF: Glucose, CSF: 66 mg/dL (ref 40–75)

## 2013-06-09 LAB — HEPATIC FUNCTION PANEL A (ARMC): Bilirubin, Direct: <0.1 mg/dL (ref 0.00–0.20)

## 2013-06-09 LAB — PROTEIN, CSF: Protein, CSF: 41 mg/dL (ref 15–45)

## 2013-06-10 LAB — BASIC METABOLIC PANEL
Anion Gap: 8 (ref 7–16)
Anion Gap: 6 — ABNORMAL LOW (ref 7–16)
BUN: 80 mg/dL — ABNORMAL HIGH (ref 7–18)
BUN: 79 mg/dL — ABNORMAL HIGH (ref 7–18)
Calcium, Total: 11.1 mg/dL — ABNORMAL HIGH (ref 8.5–10.1)
Calcium, Total: 10.8 mg/dL — ABNORMAL HIGH (ref 8.5–10.1)
Chloride: 124 mmol/L — ABNORMAL HIGH (ref 98–107)
Chloride: 125 mmol/L — ABNORMAL HIGH (ref 98–107)
Co2: 22 mmol/L (ref 21–32)
Co2: 23 mmol/L (ref 21–32)
Creatinine: 3.37 mg/dL — ABNORMAL HIGH (ref 0.60–1.30)
EGFR (African American): 15 — ABNORMAL LOW
EGFR (Non-African Amer.): 14 — ABNORMAL LOW
Glucose: 121 mg/dL — ABNORMAL HIGH (ref 65–99)
Glucose: 170 mg/dL — ABNORMAL HIGH (ref 65–99)
Osmolality: 329 (ref 275–301)
Potassium: 5 mmol/L (ref 3.5–5.1)
Sodium: 154 mmol/L — ABNORMAL HIGH (ref 136–145)
Sodium: 155 mmol/L — ABNORMAL HIGH (ref 136–145)
Sodium: 152 mmol/L — ABNORMAL HIGH (ref 136–145)

## 2013-06-10 LAB — CBC WITH DIFFERENTIAL/PLATELET
Basophil #: 0 10*3/uL (ref 0.0–0.1)
Basophil %: 0.1 %
Eosinophil #: 0 10*3/uL (ref 0.0–0.7)
Eosinophil %: 0.1 %
HCT: 19.2 % — ABNORMAL LOW (ref 35.0–47.0)
HGB: 6.3 g/dL — ABNORMAL LOW (ref 12.0–16.0)
Lymphocyte #: 0.8 10*3/uL — ABNORMAL LOW (ref 1.0–3.6)
Lymphocyte %: 4.8 %
MCH: 33.4 pg (ref 26.0–34.0)
MCV: 102 fL — ABNORMAL HIGH (ref 80–100)
Monocyte #: 1.6 x10 3/mm — ABNORMAL HIGH (ref 0.2–0.9)
Monocyte %: 9.3 %
Neutrophil #: 14.9 10*3/uL — ABNORMAL HIGH (ref 1.4–6.5)
RBC: 1.89 10*6/uL — ABNORMAL LOW (ref 3.80–5.20)
WBC: 17.4 10*3/uL — ABNORMAL HIGH (ref 3.6–11.0)

## 2013-06-10 LAB — MAGNESIUM: Magnesium: 2 mg/dL

## 2013-06-11 LAB — CBC WITH DIFFERENTIAL/PLATELET
Eosinophil #: 0 10*3/uL (ref 0.0–0.7)
HCT: 25 % — ABNORMAL LOW (ref 35.0–47.0)
HGB: 8.1 g/dL — ABNORMAL LOW (ref 12.0–16.0)
Lymphocyte %: 5.8 %
MCH: 32.1 pg (ref 26.0–34.0)
MCHC: 32.6 g/dL (ref 32.0–36.0)
MCV: 99 fL (ref 80–100)
Monocyte #: 1.2 x10 3/mm — ABNORMAL HIGH (ref 0.2–0.9)
Neutrophil #: 13.1 10*3/uL — ABNORMAL HIGH (ref 1.4–6.5)
Neutrophil %: 85.5 %
Platelet: 228 10*3/uL (ref 150–440)
RDW: 17.1 % — ABNORMAL HIGH (ref 11.5–14.5)

## 2013-06-11 LAB — BASIC METABOLIC PANEL
Anion Gap: 8 (ref 7–16)
BUN: 80 mg/dL — ABNORMAL HIGH (ref 7–18)
Creatinine: 3.22 mg/dL — ABNORMAL HIGH (ref 0.60–1.30)
Potassium: 4.6 mmol/L (ref 3.5–5.1)
Sodium: 155 mmol/L — ABNORMAL HIGH (ref 136–145)

## 2013-06-11 LAB — SODIUM: Sodium: 147 mmol/L — ABNORMAL HIGH (ref 136–145)

## 2013-06-12 LAB — BASIC METABOLIC PANEL
Calcium, Total: 10.3 mg/dL — ABNORMAL HIGH (ref 8.5–10.1)
Chloride: 118 mmol/L — ABNORMAL HIGH (ref 98–107)
Creatinine: 3.03 mg/dL — ABNORMAL HIGH (ref 0.60–1.30)
EGFR (African American): 18 — ABNORMAL LOW
EGFR (Non-African Amer.): 15 — ABNORMAL LOW
Sodium: 147 mmol/L — ABNORMAL HIGH (ref 136–145)

## 2013-06-12 LAB — CSF CULTURE

## 2013-06-13 LAB — BASIC METABOLIC PANEL
BUN: 66 mg/dL — ABNORMAL HIGH (ref 7–18)
Chloride: 116 mmol/L — ABNORMAL HIGH (ref 98–107)
Co2: 21 mmol/L (ref 21–32)
EGFR (Non-African Amer.): 16 — ABNORMAL LOW
Glucose: 94 mg/dL (ref 65–99)
Potassium: 4.4 mmol/L (ref 3.5–5.1)
Sodium: 146 mmol/L — ABNORMAL HIGH (ref 136–145)

## 2013-06-14 LAB — CULTURE, BLOOD (SINGLE)

## 2013-06-15 LAB — CULTURE, BLOOD (SINGLE)

## 2013-06-16 IMAGING — CR DG SHOULDER 2+V PORT*R*
1 series · 1 of 1 positions shown · non-contrast
Comparison: Chest x-ray on 02/19/2013

CLINICAL DATA: Fall with right shoulder pain.

PORTABLE RIGHT SHOULDER - 2+ VIEW

[AP]
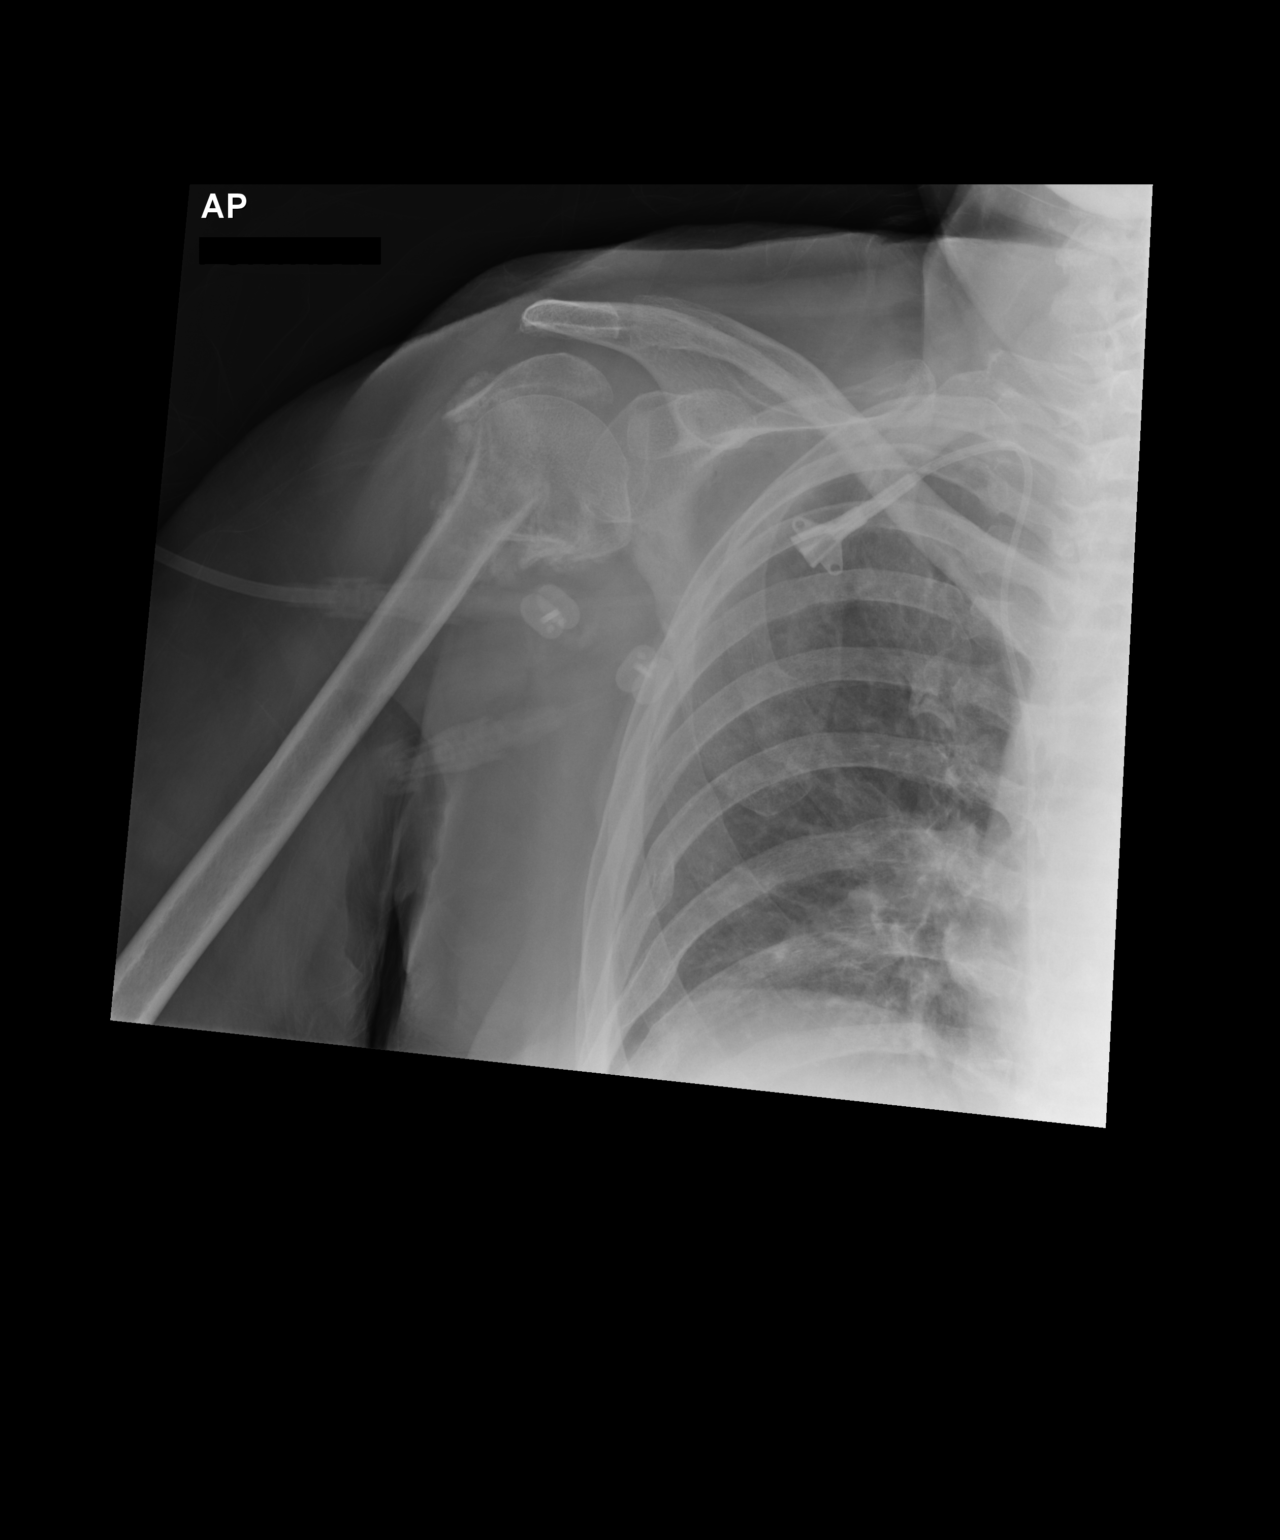

[1 of 1 positions shown; findings below may reference images not displayed]

FINDINGS: There is an impacted fracture involving the surgical neck
of the proximal right humerus.  This was present on a chest x-ray
dated 02/19/2013 and shows some partial callus formation.
IMPRESSION: Fracture of the proximal right humerus.  This was present by chest
x-ray 1 month ago.  There is some partial callus formation.

## 2013-07-10 IMAGING — CT CT HEAD WITHOUT CONTRAST
1 series · 16 of 30 positions shown, 20 images · non-contrast
Comparison: none

REASON FOR EXAM: ams
COMMENTS:   May transport without cardiac monitor

PROCEDURE:     CT  - CT HEAD WITHOUT CONTRAST  - April 12, 2013  [DATE]
RESULT:     Comparison:  02/09/2013.
TECHNIQUE: Multiple axial images from the foramen magnum to the vertex were
obtained without IV contrast.

[Series 2: soft tissue · axial · 0.41mm/px · z∈[+589,+724]mm · 16 of 30 slices shown, 20 images]
[im 2/30  brain]
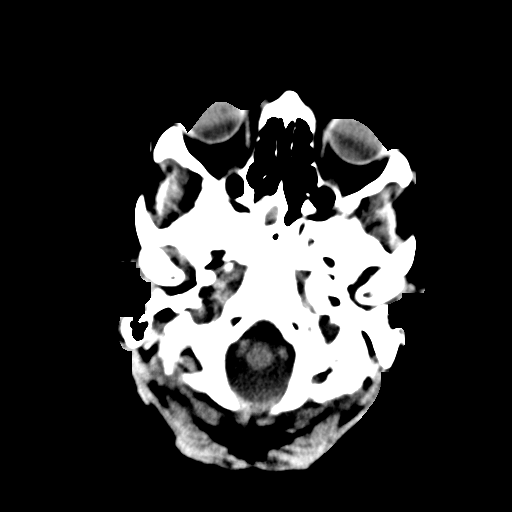
[im 2/30  bone]
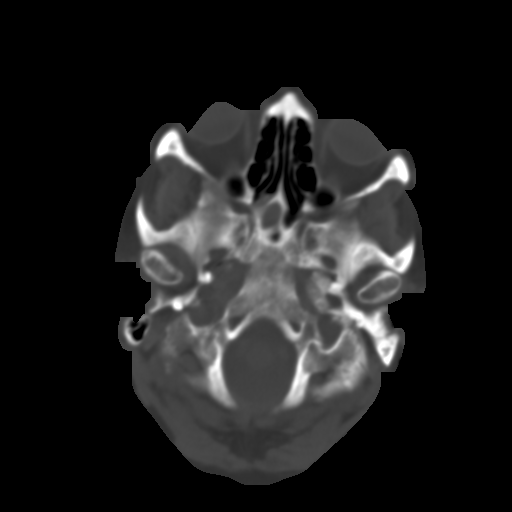
[im 4/30  brain]
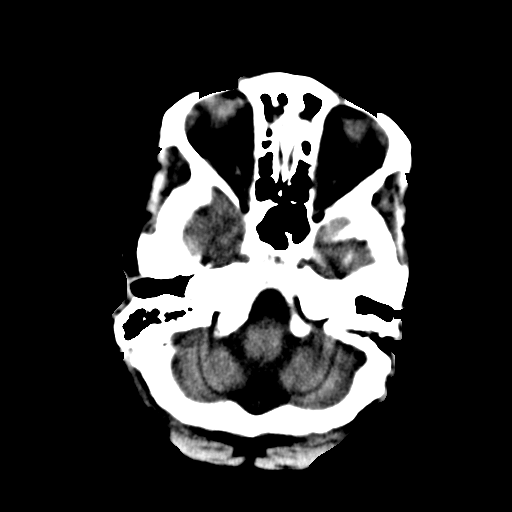
[im 6/30  brain]
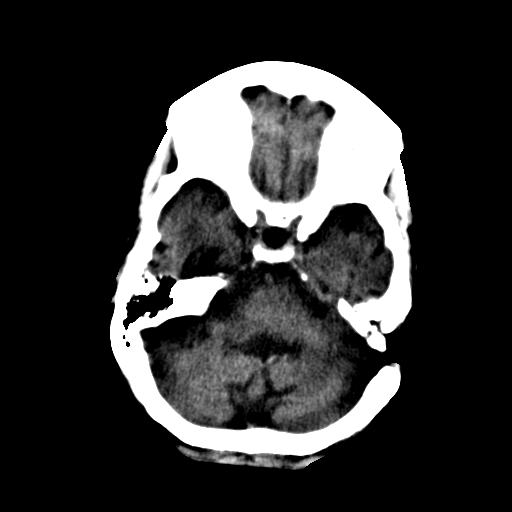
[im 8/30  brain]
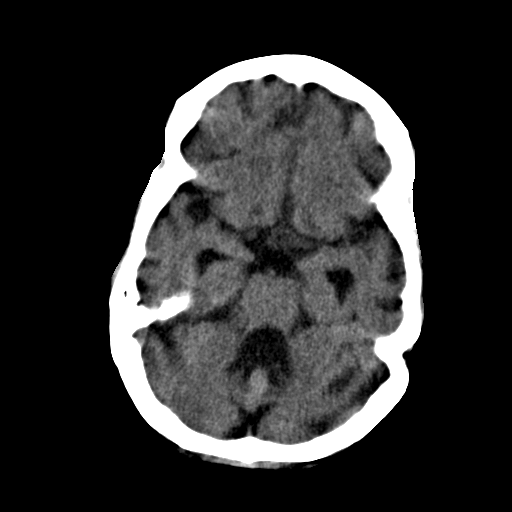
[im 9/30  brain]
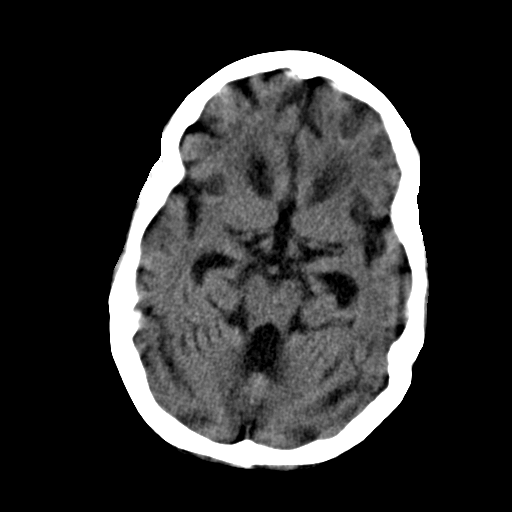
[im 9/30  bone]
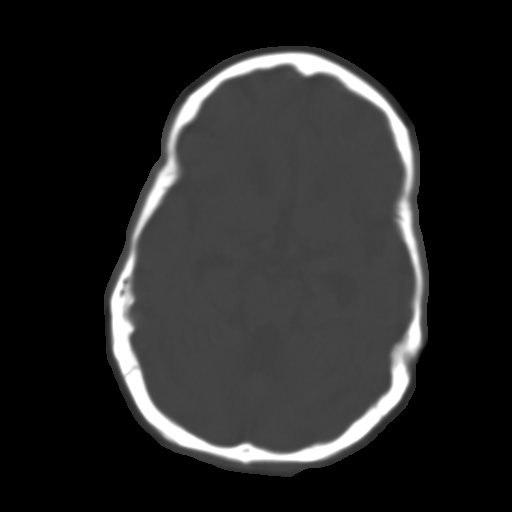
[im 11/30  brain]
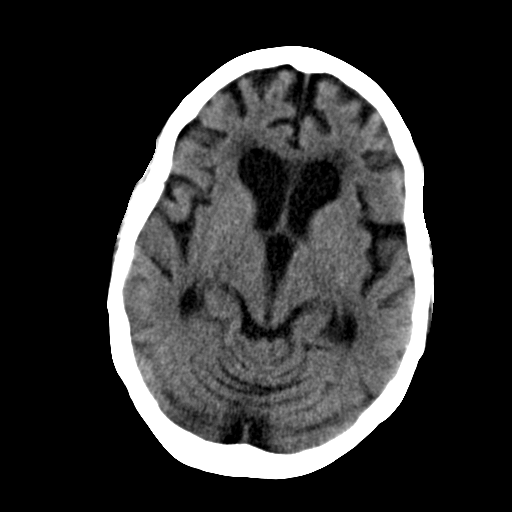
[im 13/30  brain]
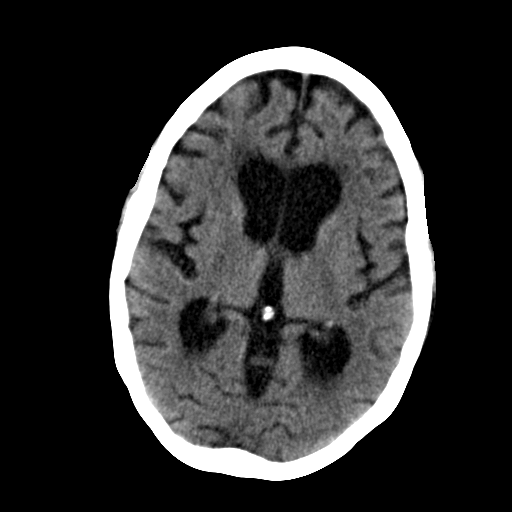
[im 15/30  brain]
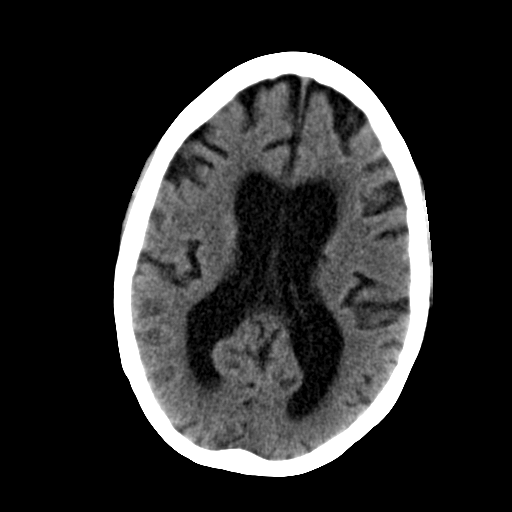
[im 16/30  brain]
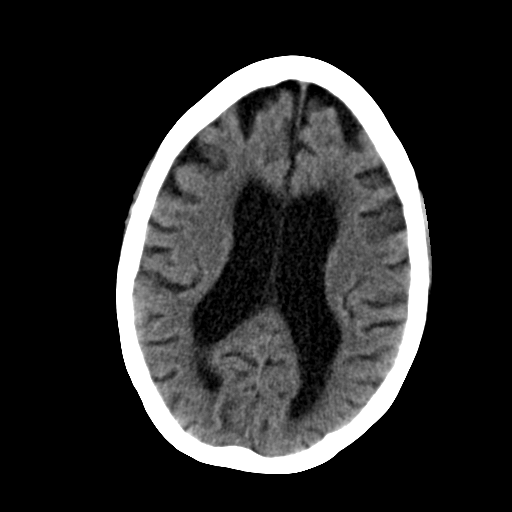
[im 16/30  bone]
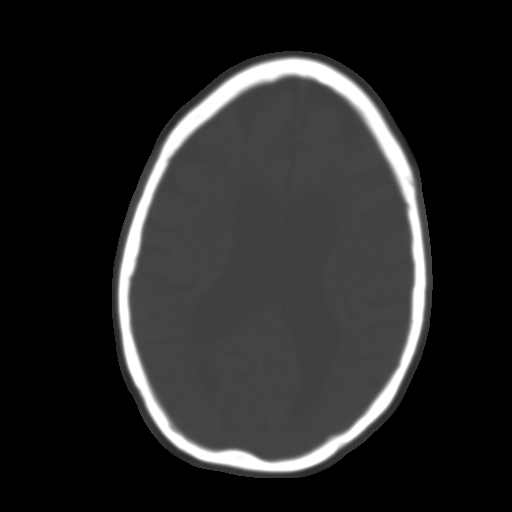
[im 18/30  brain]
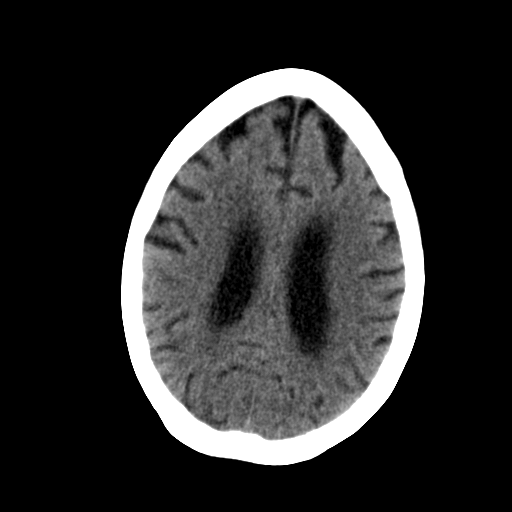
[im 20/30  brain]
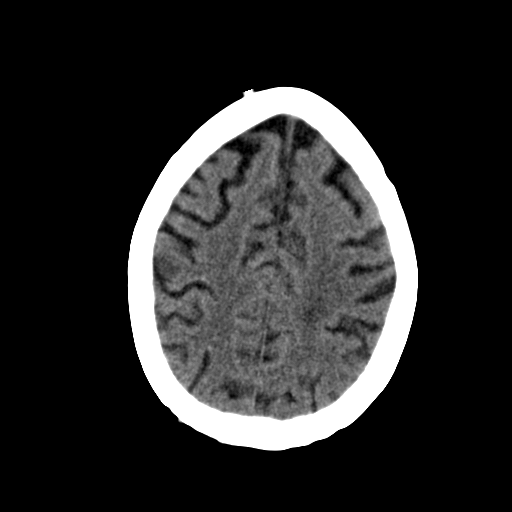
[im 22/30  brain]
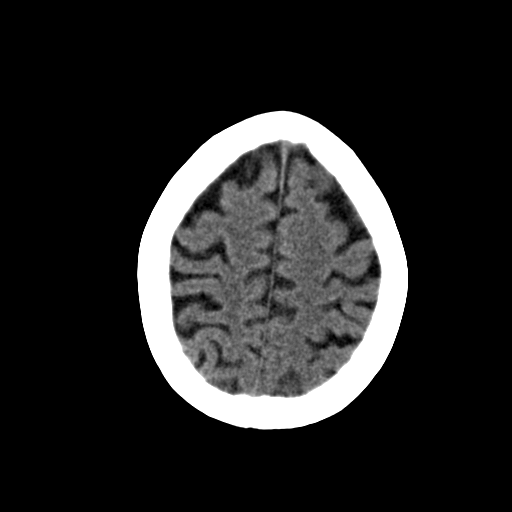
[im 23/30  brain]
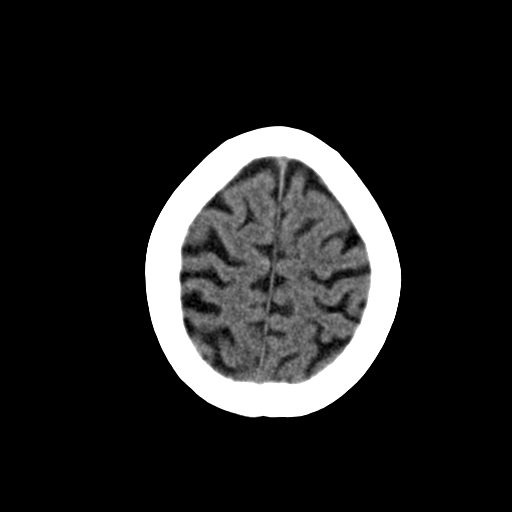
[im 23/30  bone]
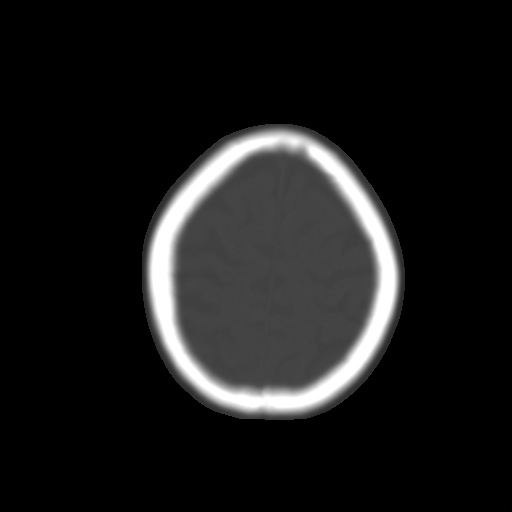
[im 25/30  brain]
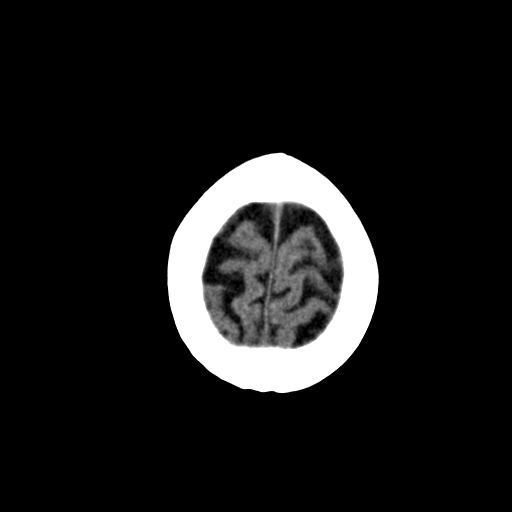
[im 27/30  brain]
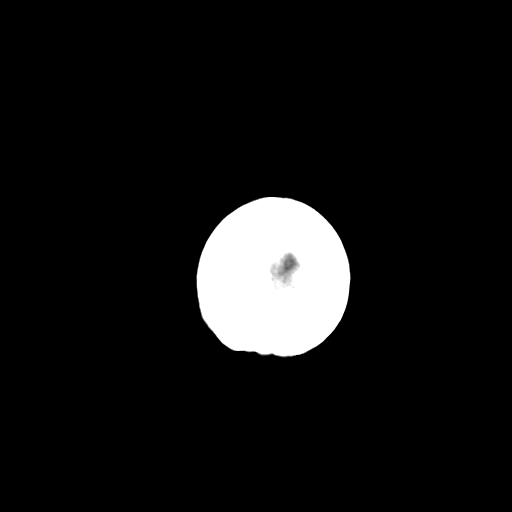
[im 29/30  brain]
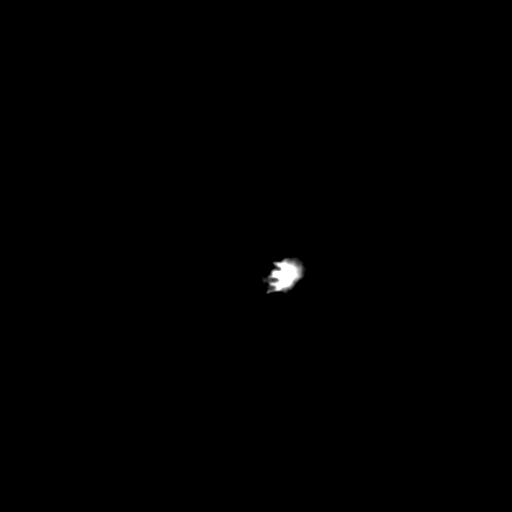

[16 of 30 positions shown; findings below may reference images not displayed]

FINDINGS: There is no evidence of mass effect, midline shift, or extra-axial fluid
collections.  There is no evidence of a space-occupying lesion or
intracranial hemorrhage. There is no evidence of a cortical-based area of
acute infarction. There is generalized cerebral atrophy. There is
periventricular white matter low attenuation likely secondary to
microangiopathy.

The ventricles and sulci are appropriate for the patient's age. The basal
cisterns are patent.

Visualized portions of the orbits are unremarkable. There is evidence of
prior left mastoidectomy.

The osseous structures are unremarkable.
IMPRESSION: No acute intracranial process.

[REDACTED]

## 2013-07-10 IMAGING — CR DG CHEST 1V PORT
1 series · 1 of 1 positions shown · non-contrast
Comparison: none

REASON FOR EXAM: Altered Mental Status
COMMENTS:

[ap]
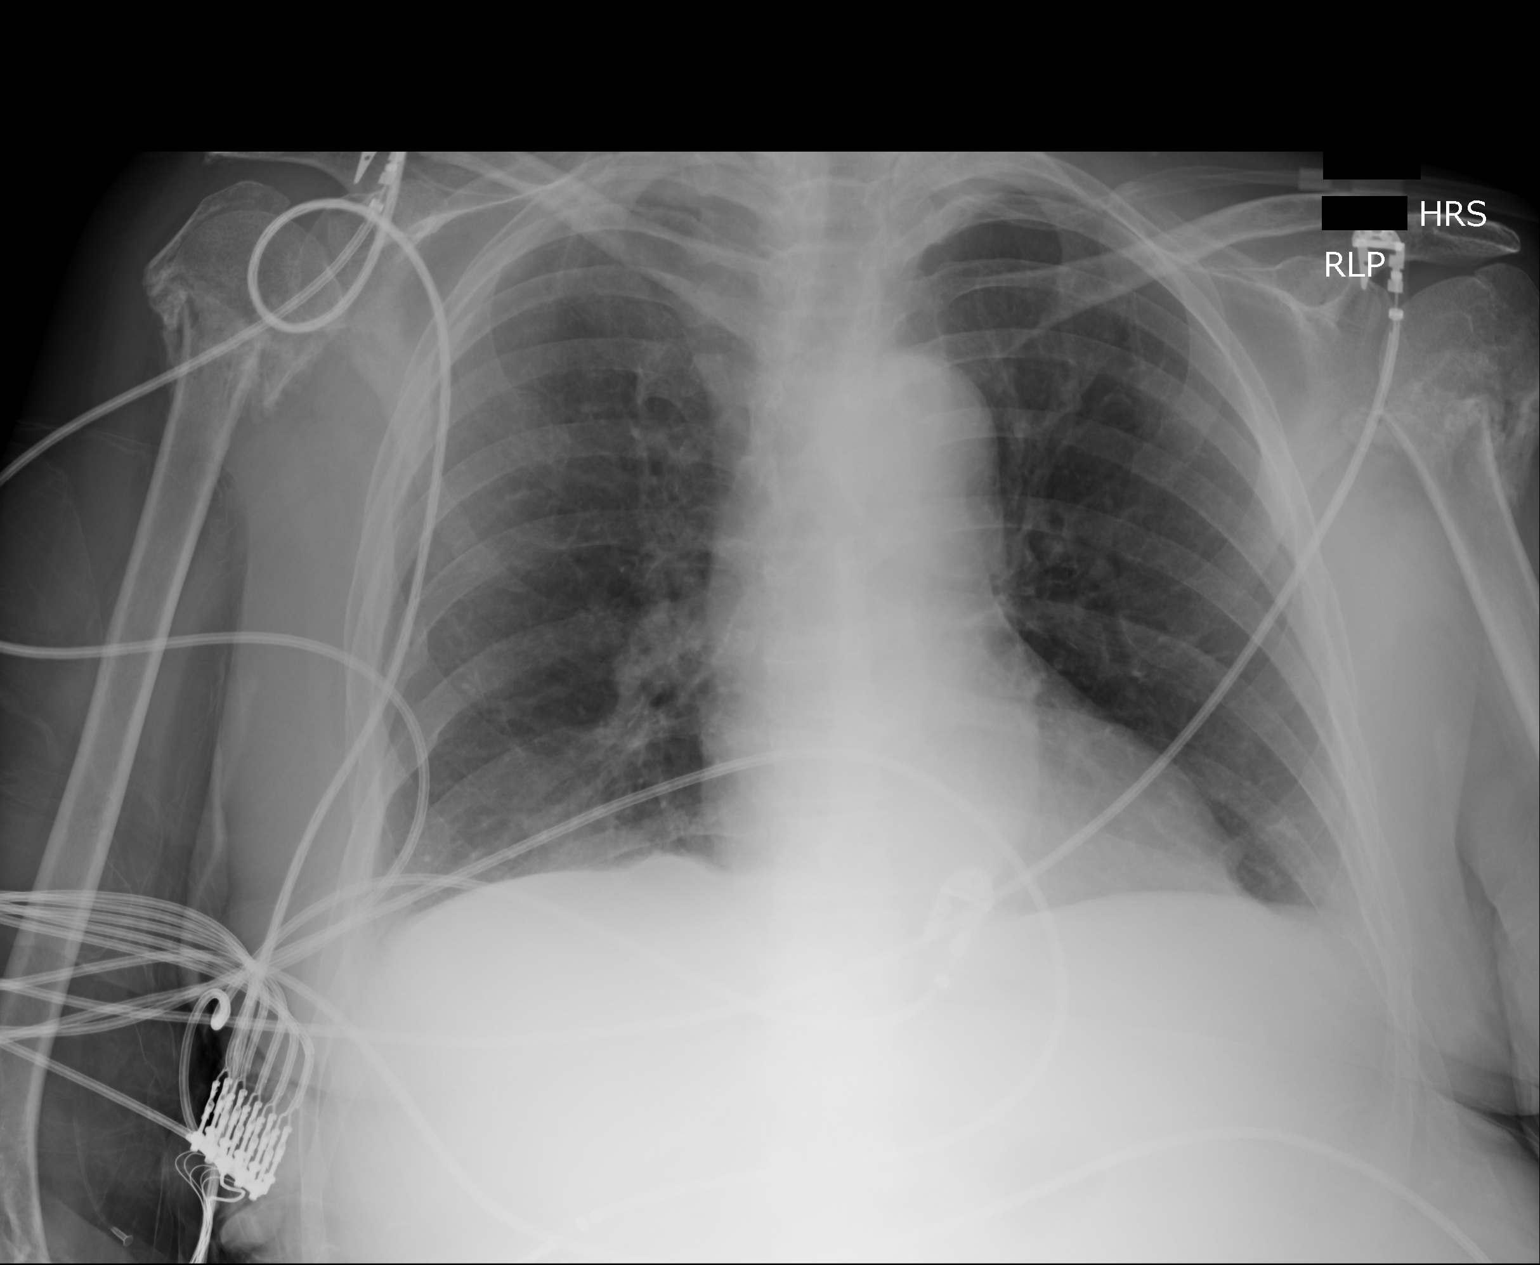

[1 of 1 positions shown; findings below may reference images not displayed]

PROCEDURE:     DXR - DXR PORTABLE CHEST SINGLE VIEW  - April 12, 2013  [DATE]

RESULT:     Comparison made to study February 15, 2013.

The lungs are better inflated today and appear clear. There is evidence of
ongoing healing of the subcapital fractures of the humeri. The cardiac
silhouette is normal in size. The pulmonary vascularity is not engorged.
There is no pleural fluid collection.
IMPRESSION: 1. No acute cardiopulmonary abnormality is demonstrated.
2. Ongoing healing of the bilateral humeral neck fractures is demonstrated.
Deformity persists.

[REDACTED]

## 2013-07-16 ENCOUNTER — Ambulatory Visit: Payer: Self-pay | Admitting: Internal Medicine

## 2013-07-16 DEATH — deceased

## 2015-04-07 NOTE — Discharge Summary (Signed)
PATIENT NAME:  Deanna Mcintyre, Deanna Mcintyre MR#:  [id number] DATE OF BIRTH:  Sep 16, 1946  DATE OF ADMISSION:  02/07/2013 DATE OF DISCHARGE:  02/18/2013  NOTE: Please refer to interim discharge summary dictated by Dr. Tressia Miners on 02/16/2013 where most of the details of this hospitalization are laid down. The patient was admitted on 02/07/2013, discharge summary on 02/18/2013. Refer also to H and P dictated by Dr. Bridgett Larsson.   PRIMARY CARE PHYSICIAN: Dr. Viviana Simpler  PRIMARY NEPHROLOGIST: Dr. Smith Mince, [facility]   CURRENT ISSUES: 1. Acute metabolic encephalopathy, now resolved. 2. Hypernatremia.  3. Nephrogenic diabetes insipidus. 4. Persistent fevers likely due to atelectasis.  5. Acute on chronic blood low anemia.  6. Acute renal failure on chronic kidney disease.  7. Bilateral humeral fractures.  8. Chronic recurrent falls.  9. Bipolar disorder.  10. History of breast cancer.  11. History of left acoustic neuroma.   MEDICATIONS: Have been described by Dr. Tressia Miners in Dr. Wyatt Portela note. No changes right now.   NEW TESTS:  See Dr. Wyatt Portela discharge summary. Mostly, a creatinine of 2.8 today, a sodium of 145, a CO2 of 28.  Hematocrit of 8.2 for which the patient is on Procrit. Blood cultures: No growth. Urine cultures: No growth.   HOSPITAL COURSE: She has been seen by Dr. Clayborn Bigness, who thinks that the fever that has been recurring since admission with blood cultures negative, with normal Doppler ultrasounds, with unrevealing chest x-ray is very likely related to atelectasis as the patient has normal white blood count, normal LFTs for which he just recommended continue to observe.  If the fevers persist, a CT of the chest will be indicated as well as abdomen and pelvis, but it will be limited due to the lack of contrast that we could use. For now, the patient is stable actually, her temperature is normal right now; so  I think that she could go to LTAC and be monitored there.   Dr. Rudene Christians has  evaluated the patient as well, and he thinks that he could re-evaluate her in 2 to 4 weeks to see if she is available to have her surgery.   As far as her new diagnosis of diabetes insipidus, Dr. Gabriel Carina has recommended mostly for her to have water available all the time on a thickened basis as the patient has risk for aspiration. So, the patient was diagnosed with DI based on polyuria and hypernatremia and the fact that she had history of use of lithium in the past. After desmopressin, there was a change of urine osmolality from 225 to 214, and the lack of response from this is related to problem being nephrogenic, and the treatment should be to encourage water intake. It is going to be based on thickened fluids.   Other than that, the patient has been stable. I had a long discussion with the patient's husband, and he is agreeable for her to go to an LTAC today.  I think the patient is able to be discharged to an [facility] today in good condition.   TIME SPENT: I spent about 40 minutes with this patient.   ____________________________ Elkhart Sink, MD rsg:cb D: 02/18/2013 12:42:05 ET T: 02/18/2013 13:53:41 ET JOB#: [id number]  cc: Tamiami Sink, MD, <Dictator> Laurene Footman, MD Trinda Pascal, MD Jolanta B. Bary Leriche, MD Heinz Knuckles Blocker, MD A. Lavone Orn, MD Hemang K. Manuella Ghazi, MD [place] MD ELECTRONICALLY SIGNED 03/02/2013 13:02

## 2015-04-07 NOTE — Consult Note (Signed)
Brief Consult Note: Diagnosis: Bipolar affective disorder most recent episode manic in remission.   Patient was seen by consultant.   Consult note dictated.   Recommend further assessment or treatment.   Orders entered.   Comments: Ms. Deanna Mcintyre has a h/o biolar illness. She has been maintained on a combination od Depakote 1500 mg/day and Cymbalta 30 mg/day. She is admitted for fractured arms. She has been given depakote 1000 mg/day and no Cymbalta here. She has a h/o falls and confusion. She has been confused in the hospital as well.   MSE: The patient is unable to participate in the interview. She answered only one question.   PLAN: 1. R/O high ammonia in the course of depakote treatment. This had to be redrawn today. Waiting for results.   2. R/O multifactorial delirium. Supportive treatment.  3. Bipolar. The patient can't tolerate Lithium any longer. will continue depakote unless ammonia elevated. OK to hold Cymbalta. The patient has a h/o bad bipolar. She must be on a mood stabilizer.  4. I will follow along.  4.  Electronic Signatures: Orson Slick (MD)  (Signed [id number] 20:35)  Authored: Brief Consult Note   Last Updated: 25-Feb-14 20:35 by Orson Slick (MD)

## 2015-04-07 NOTE — Consult Note (Signed)
Chief Complaint:  Subjective/Chief Complaint Follow up nephrology consult for acute on chronic kidney disease  Patient had swallow study by ST yesterday who rec soft mechanical diet and consideration of neurology consult.  Per her husband who is at the bedside this morning she is somewhat lethargic today.  She had a couple sips of nutritional shake and then went back to sleep.  Likely she will be transferred to SNF to allow ecchymoses to improve prior to surgical intervention.   VITAL SIGNS/ANCILLARY NOTES: **Vital Signs.:   25-Feb-14 09:11  Vital Signs Type Recheck  Temperature Temperature (F) 100.2  Celsius 37.8  Pulse Ox % Pulse Ox % 95  Pulse Ox Activity Level  At rest  Oxygen Delivery 2L; Nasal Cannula  *Intake and Output.:   Shift 25-Feb-14 07:00  Grand Totals Intake:   Output:  1550    Net:  -82 24 Hr.:  -1638  Urine ml     Out:  1550  Length of Stay Totals Intake:  4380 Output:  5225    Net:  -845   Brief Assessment:  Additional Physical Exam Gen: lying at 30 degrees in bed comfortably, asleep, awakens to minimal stimulation Eyes: anicteric ENT: MMM Neck : no JVD CV: tachycardic but regular, no S4 Lungs: clear anteriorly and laterally, normal WOB Abd: soft, nontender Extr: no edema GU: foley draining clear urine Neurologic:  answers some questions with simple answers but mumbles incoherent answers to others, 4-/5 stregth globally but unchanged from prior Skin:  warm and moist   Lab Results:  Routine Chem:  24-Feb-14 05:13   Creatinine (comp)  4.41  eGFR (African American)  11  eGFR (Non-African American)  10 (eGFR values <30m/min/1.73 m2 may be an indication of chronic kidney disease (CKD). Calculated eGFR is useful in patients with stable renal function. The eGFR calculation will not be reliable in acutely ill patients when serum creatinine is changing rapidly. It is not useful in  patients on dialysis. The eGFR calculation may not be applicable to  patients at the low and high extremes of body sizes, pregnant women, and vegetarians.)  Glucose, Serum  101  BUN  68  Sodium, Serum 145  Potassium, Serum 4.4  Chloride, Serum  116  CO2, Serum  19  Calcium (Total), Serum  8.3  Anion Gap 10  Osmolality (calc) 309  Result Comment AUTOMATED DIFFERENTIAL - AUTO DIFF IS CONSISTENT...TPL  Result(s) reported on 08 Feb 2013 at 05:58AM.  25-Feb-14 01:48   Creatinine (comp)  4.28  eGFR (African American)  12  eGFR (Non-African American)  10 (eGFR values <656mmin/1.73 m2 may be an indication of chronic kidney disease (CKD). Calculated eGFR is useful in patients with stable renal function. The eGFR calculation will not be reliable in acutely ill patients when serum creatinine is changing rapidly. It is not useful in  patients on dialysis. The eGFR calculation may not be applicable to patients at the low and high extremes of body sizes, pregnant women, and vegetarians.)  Routine Hem:  24-Feb-14 05:13   WBC (CBC) 10.8  RBC (CBC)  2.35  Hemoglobin (CBC)  7.6  Hematocrit (CBC)  22.0  Platelet Count (CBC)  85  MCV 93  MCH 32.2  MCHC 34.5  RDW  17.5  Neutrophil % 68.3  Lymphocyte % 9.3  Monocyte % 22.3  Eosinophil % 0.0  Basophil % 0.1  Neutrophil #  7.4  Lymphocyte # 1.0  Monocyte #  2.4  Eosinophil # 0.0  Basophil # 0.0  Assessment/Plan:  Assessment/Plan:  Assessment 35F with mutliple medical problems who is seen for AoCKD in setting of BL humeral fractures.   Plan **AoCKD:  Renal function is slowly improving and she has great urine output.   --If tolerating po intake would hold on MIVF for now --OK if hospitalist orders foley removal.  As long as renal function improving I don't feel it's imperative to measure strict I/Os.  However, should function worsen and she remain incontinent, I would recommend to replace.  This was discussed with her husband.  --Check PVR q6h if foley removed to ensure no urinary retention in setting of  opioid use --Avoid contrast, NSAIDs as you are  **Metabolic acidosis:  secondary to CKD with bicarb 19 --cont oral bicarb as able, no indication to give IV bicarb if can't take po   **HTN:  normotensive  **Anemia:  Multifactorial with anemia of CKD and acute blood loss. Improved s/p transfusion but Hb <8 -- Will give epogen 50u/kg or formulary equivalent  Will continue to follow closely, please page me with any questions or concerns.  Leanora Cover pager [phone]   Electronic Signatures: Justin Mend (MD)  (Signed [phone] 10:47)  Authored: Chief Complaint, VITAL SIGNS/ANCILLARY NOTES, Brief Assessment, Lab Results, Assessment/Plan   Last Updated: 25-Feb-14 10:47 by Justin Mend (MD)

## 2015-04-07 NOTE — Consult Note (Signed)
Chief Complaint:  Subjective/Chief Complaint Patient febrile over past 24h.  Started ceftriaxone for possible UTI.  Seen by psych and neurology - delerium.  MRI brain with nothing acute.   VITAL SIGNS/ANCILLARY NOTES: **Vital Signs.:   26-Feb-14 13:38  Vital Signs Type Routine  Temperature Temperature (F) 100.5  Celsius 38  Temperature Source oral  Pulse Pulse 100  Respirations Respirations 18  Systolic BP Systolic BP 790  Diastolic BP (mmHg) Diastolic BP (mmHg) 76  Mean BP 92  Pulse Ox % Pulse Ox % 91  Pulse Ox Activity Level  At rest  Oxygen Delivery Room Air/ 21 %  *Intake and Output.:   Shift 26-Feb-14 07:00  Grand Totals Intake:   Output:  850    Net:  -850 24 Hr.:  -2427  Urine ml     Out:  850  Length of Stay Totals Intake:  4603 Output:  7875    Net:  -3272   Brief Assessment:  Additional Physical Exam Gen: lying at 45 degrees in bed comfortably, awake Eyes: anicteric ENT: MMM Neck : no JVD CV: tachycardic but regular, no S4 Lungs: clear anteriorly and laterally, normal WOB Abd: soft, nontender Extr: +UE edema, no LE edema GU: foley draining clear urine Neurologic:  states name fully, answers some questions with simple answers but mumbles incoherent answers to others, 4-/5 stregth globally but unchanged from prior Skin:  warm and moist   Lab Results:  Hepatic:  25-Feb-14 15:43   Bilirubin, Total 0.4  Bilirubin, Direct < 0.1 (Result(s) reported on 09 Feb 2013 at 04:44PM.)  Alkaline Phosphatase  44  SGPT (ALT)  11  SGOT (AST)  14  Total Protein, Serum  6.0  Albumin, Serum  1.8  TDMs:  25-Feb-14 15:43   Valproic Acid, Serum 58 (50-100 POTENTIALLY TOXIC:  > 200 mcg/mL)  Routine Micro:  24-Feb-14 14:11   Micro Text Report URINE CULTURE   COMMENT                   NO GROWTH IN 18-24 HOURS   ANTIBIOTIC                       Culture Comment NO GROWTH IN 18-24 HOURS  Result(s) reported on 09 Feb 2013 at 12:33PM.  Specimen Source INDWELLING CATHETER   25-Feb-14 15:43   Micro Text Report BLOOD CULTURE   COMMENT                   NO GROWTH IN 8-12 HOURS   ANTIBIOTIC                       Culture Comment NO GROWTH IN 8-12 HOURS  Result(s) reported on 10 Feb 2013 at 10:19AM.  Routine Chem:  26-Feb-14 12:15   Glucose, Serum  122  BUN  90  Creatinine (comp)  4.37  Sodium, Serum  > 160  Potassium, Serum 5.1  Chloride, Serum  > 128  CO2, Serum 21  Calcium (Total), Serum 9.8  Anion Gap SEE COMMENT  Osmolality (calc) SEE COMMENT  eGFR (African American)  12  eGFR (Non-African American)  10 (eGFR values <45m/min/1.73 m2 may be an indication of chronic kidney disease (CKD). Calculated eGFR is useful in patients with stable renal function. The eGFR calculation will not be reliable in acutely ill patients when serum creatinine is changing rapidly. It is not useful in  patients on dialysis. The eGFR calculation may not be  applicable to patients at the low and high extremes of body sizes, pregnant women, and vegetarians.)  Result Comment SODIUM - RESULTS VERIFIED BY REPEAT TESTING.  - NOTIFIED OF CRITICAL VALUE  - CALLED RESULT TO MICHELLE VASTERLING AT  - 5885 02/10/13-DAC  - READ-BACK PROCESS PERFORMED. ANION GAP/OSMOLALITY - UNABLE TO CALCULATE VALUE DUE TO NON-  - NUMERIC VALUE WITHIN THE CALCULATION.  Result(s) reported on 10 Feb 2013 at 12:54PM.  Routine Hem:  25-Feb-14 15:43   Platelet Count (CBC)  108  WBC (CBC) 8.3  RBC (CBC)  2.56  Hemoglobin (CBC)  8.3  Hematocrit (CBC)  24.2  MCV 95  MCH 32.4  MCHC 34.2  RDW  18.5  Neutrophil % 82.6  Lymphocyte % 5.7  Monocyte % 11.7  Eosinophil % 0.0  Basophil % 0.0  Neutrophil #  6.9  Lymphocyte #  0.5  Monocyte #  1.0  Eosinophil # 0.0  Basophil # 0.0 (Result(s) reported on 09 Feb 2013 at 04:12PM.)   Assessment/Plan:  Assessment/Plan:  Assessment 16F with multiple medical problems who is seen for AoCKD in setting of BL humeral fractures.   Plan **AoCKD:  Renal  function slightly worse today, likely in the setting of infection with fever, tachycardia.  Still with good UOP, potassium OK.    --No current need for dialysis though this may become an issues.  Discussed this decision making process with the family.   --OK if hospitalist orders foley removal.  As long as renal function improving I don't feel it's imperative to measure strict I/Os.  However, should function worsen and she remain incontinent, I would recommend to replace.  This was discussed with her husband.  --Check PVR q6h if foley removed to ensure no urinary retention in setting of opioid use --Avoid contrast, NSAIDs as you are  **Hypernatremia:  Na rose from 144 to 160 from yesterday.  --Agree with serial rechecks q2h as ordered and D5W --Aim for rate of correction if 1-18mq/hr  **SIRS:  management per hospitalist.  Dose antibiotics for renal function.    **Metabolic acidosis:  secondary to CKD with bicarb 19 --cont oral bicarb as able, no indication to give IV bicarb if can't take po   **HTN:  normotensive  **Anemia:  Multifactorial with anemia of CKD and acute blood loss. Improved s/p transfusion but Hb <8.  Gave epogen 02/09/2013.  Will continue to follow closely, please page me with any questions or concerns.  LLeanora Coverpager [phone]  Electronic Signatures: KJustin Mend(MD)  (Signed [id number])  Authored: Chief Complaint, VITAL SIGNS/ANCILLARY NOTES, Brief Assessment, Lab Results, Assessment/Plan   Last Updated: 26-Feb-14 15:10 by KJustin Mend(MD)

## 2015-04-07 NOTE — Consult Note (Signed)
   Comments   I met with pt's husband, son and daughter. Discussed pt's current medical condition. Family confirms pt's decline since 03/2013 and understand that she is unlikely to recover to her previous baseline. They plan for her to return to [facility] at discharge. We discussed code status at length. I was able to go over her advance directive with them. They are all in agreement with DNR. Order entered and out-of-facility DNR completed and placed in chart. I also completed a MOST form which is in pt's chart.   Electronic Signatures: Keyondre Hepburn, Izora Gala (MD)  (Signed 25-Jun-14 15:02)  Authored: Palliative Care   Last Updated: 25-Jun-14 15:02 by Angelyn Osterberg, Izora Gala (MD)

## 2015-04-07 NOTE — Consult Note (Signed)
Brief Consult Note: Diagnosis: delirium, multifactorial toxic metabolic encephalopathy.   Patient was seen by consultant.   Consult note dictated.   Comments: - elderly WF with recent fall (right jaw/tongue trauma - likely had concussion as well), recent severe anemia (as low as Hg 5.2), hypoxia, severe pain (recent bil humurus fracture). - Concern for baseline cognitive impairment (strop driving in 1/46), husband taking care of finances for a while, sister stopped calling after pt not making sense on the phone for a while etc. - frequent recurrent falls, incontinence, ? mild ventriculomegaly on CT head - ? Normal pressure hydrocephalus - also as WM microvascular ischemic changes. - Consider MRI brain, if possible, - VPA level OK, ammonia has been ordered. - added TSH, Vit B12, MMA. 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 tardive dyskinesia (had exposure to antipsychotics) - will follow less frequently.  Electronic Signatures: Ray Church (MD)  (Signed [phone] 20:43)  Authored: Brief Consult Note   Last Updated: 25-Feb-14 20:43 by Ray Church (MD)

## 2015-04-07 NOTE — Consult Note (Signed)
Impression:     69yo female w/ h/o CRI, multiple falls admitted with bilateral humerus fractures who has developed fever.     She has had negative blood cultures and her u/a does not show significant inflammation.  Her WBC is normal.  She has a foley and a triple lumen in place.  Venous dopplers are negative.    So far, there is no obvious infectious etiology to her fever.    Will continue the ceftriaxone for another day, then stop if the blood cultures remain negative..    If she spikes again, would consider removing the triple lumen catheter.    Will give her an incentive spirometer.  She has been in bed since her admission and may have atelectasis.  Her CXR is not very impressive for infiltrate. 6)     She had a long bone fracture and the possibility of fat emboli should be considered.  She has no respiratory symptoms, however.  Electronic Signatures: Blocker MPH, Heinz Knuckles (MD) (Signed on 04-Mar-14 11:53)  Authored   Last Updated: 04-Mar-14 12:06 by Blocker MPH, Heinz Knuckles (MD)

## 2015-04-07 NOTE — Discharge Summary (Signed)
PATIENT NAME:  Deanna Mcintyre, Deanna Mcintyre MR#:  [id number] DATE OF BIRTH:  Apr 25, 1946  DATE OF ADMISSION:  04/12/2013 DATE OF DISCHARGE:  04/20/2013  PRIMARY CARE PHYSICIAN:  Viviana Simpler, MD  DISCHARGE DIAGNOSES: 1.  Altered mental status. 2.  Metabolic encephalopathy. 3.  Hypernatremia.  4.  Nephrogenic diabetes insipidus. 5.  Chronic kidney disease and dehydration.  6.  Hypertension.  7.  Anemia.   CODE STATUS: FULL CODE.   CONDITION: Stable.   HOME MEDICATIONS: Please refer to the [facility] physician discharge instruction medication reconciliation list. The patient's medication was on hold due to altered mental status.   DIET: Low sodium, low fat, low cholesterol diet, mechanic, soft, thin liquids, ground meat, and gravy added.  Please see the detailed instructions in [facility] discharge instructions.   ACTIVITY: As tolerated.   FOLLOW-UP CARE: With PCP within 1 to 2 weeks. Encourage free water intake.   REASON FOR ADMISSION: Unresponsiveness.   HOSPITAL COURSE: The patient is a 69 year old Caucasian female with a history of hypernatremia, nephrogenic diabetes insipidus, anemia and CKD who was brought to the ED from the nursing home due to unresponsiveness. The patient was noted to have a high sodium at 158.  Was treated with D5 water and admitted for altered mental status and hypernatremia. For detailed history and physical examination, please refer to the admission note dictated by me.    Laboratory data on admission date are as follows:  BUN 80, creatinine 3.25, sodium 158, potassium 4.9, chloride 125, osmolality 336, troponin 0.04, and glucose 86. Urinalysis is negative.   After admission, the patient was treated with n.p.o., D5W IV, aspiration, fall and seizure precautions. In addition, we held fentanyl patch and Lidoderm patch.  After above-mentioned treatment, the patient's mental status has improved, sodium decreased to 140.  The patient recovered to her baseline status. She is eating  well, no complaints. The patient was supposed to be discharged on May 2nd, but has been waiting for PASAR and just now the patient got PASAR. She is clinically stable and will be discharged back to nursing home today. I discussed the patient's discharge plan with the patient's husband and case Freight forwarder.   TIME SPENT: About 33 minutes.  ____________________________ Demetrios Loll, MD qc:sb D: 04/20/2013 16:03:14 ET T: 04/20/2013 16:37:49 ET JOB#: [id number]  cc: Demetrios Loll, MD, <Dictator> Demetrios Loll MD ELECTRONICALLY SIGNED 04/21/2013 15:13

## 2015-04-07 NOTE — Consult Note (Signed)
Admit Diagnosis:   FALL: Onset Date: 08-Feb-2013, Status: Active, Description: FALL   General Aspect Reason for Consultation:  acute on chronic kidney disease   Present Illness 69yo Caucasian woman with multiple medical problems including advanced CKD secondary to remote lithium use, hypertension, h/o breast cancer and frequent falls who is currently admitted after mechanical fall resulting in BL humeral fractures.  She is seen in consultation regarding AoCKD.  She is followed by [facility] in the outpatient clinic for this problem by Dr. Smith Mince.   Ms. Evola was admitted 02/05/2013 at which time her creatinine was 3.9 up from her recent baseline of about 3.  It rose to 4.8 on 2/23 at which time IVF were given and she had an unrevealing renal US.  She was incontinent of urine several times yesterday but following placement of a foley catheter around 5pm yesterday she's had 2255m UOP recorded.  Her creatinine is down to 4.4 today. No contrast administration, no nephrotoxic medications given and no hypotension identified.    This morning she says she's feeling ok but seems a bit lethargic.  She denies thirst, nausea, diarrhea, dysuria, fevers, chills, chest pain, abdominal pain.    She will likely require operative repair of one of the fractures but a formal plan is not yet in place.  Her IV hydration has been off for about 1 hr but the RN and myself both noted drooling when she tries to drink and thus a speech consult has been ordered to eval swallowing   Home Medications: Medication Instructions Status  Aspirin Enteric Coated 81 mg oral delayed release tablet 1 tab(s) orally once a day (in the morning) Active  Cymbalta 30 mg oral delayed release capsule 1 cap(s) orally once a day (in the morning) Active  propranolol 60 mg oral capsule, extended release 1 cap(s) orally once a day (in the morning) Active  sodium bicarbonate 650 mg oral tablet 1 tab(s) orally once a day Active  Tears  Naturale II 2 drop(s) to each affected eye 4 times a day as needed. Active  trazodone 50 mg oral tablet 1 tab(s) orally once a day (at bedtime) Active  Vitamin D3 1000 intl units oral capsule 1 cap(s) orally once a day (in the morning) Active  divalproex sodium 250 mg oral delayed release tablet 2 tabs (5087m orally once a day (in the morning), and 4 tabs (100031morally once a day (at bedtime). 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 LetSilvio PateFamily History no family history of CKD   HEENT PERLA  mild droop at corner of R eye, MMM, bruise on lower R lip   Neck/Nodes Supple  Other  no JVD   Chest/Lungs Clear   Cardiovascular No Murmurs or Gallops  Normal Sinus Rhythm   Abdomen Benign   Genitalia Other  foley draining clear yellow urine   Musculoskeletal BL UE slings   Neurological oriented, slow to answer questions, difficulty with attention, 4-/5 BL grip strength, speech garbled and difficult to understand at times   Skin Warm  Dry   Nursing/Ancillary Notes: **Vital Signs.:   24-Feb-14 08:33  Vital Signs Type Routine  Temperature Temperature (F) 98  Celsius 36.6  Temperature Source oral  Pulse Pulse 97  Respirations Respirations 17  Systolic BP Systolic BP 619  Diastolic BP (mmHg) Diastolic BP (mmHg) 76  Mean BP 94  Pulse Ox % Pulse Ox % 93  Pulse Ox Activity Level  At rest  Oxygen Delivery Room Air/ 21 %  *Intake and Output.:   Daily 24-Feb-14 07:00  Grand Totals Intake:  3754 Output:  2250    Net:  1504 24 Hr.:  1504  Oral Intake      In:  360  Blood Product      In:  298  IV (Primary)      In:  3096  Urine ml     Out:  2250  Length of  Stay Totals Intake:  5093 Output:  2671    Net:  1569     Routine Chem:  24-Feb-14 05:13   Creatinine (comp)  4.41  eGFR (African American)  11  eGFR (Non-African American)  10 (eGFR values <42m/min/1.73 m2 may be an indication of chronic kidney disease (CKD). Calculated eGFR is useful in patients with stable renal function. The eGFR calculation will not be reliable in acutely ill patients when serum creatinine is changing rapidly. It is not useful in  patients on dialysis. The eGFR calculation may not be applicable to patients at the low and high extremes of body sizes, pregnant women, and vegetarians.)  Glucose, Serum  101  BUN  68  Sodium, Serum 145  Potassium, Serum 4.4  Chloride, Serum  116  CO2, Serum  19  Anion Gap 10  Osmolality (calc) 309  Result Comment AUTOMATED DIFFERENTIAL - AUTO DIFF IS CONSISTENT...TPL  Result(s) reported on 08 Feb 2013 at 05:58AM.  Routine UA:  22-Feb-14 00:53   Color (UA) Yellow  Clarity (UA) Hazy  Glucose (UA) Negative  Bilirubin (UA) Negative  Ketones (UA) Negative  Specific Gravity (UA) 1.013  Blood (UA) Negative  pH (UA) 5.0  Protein (UA) Negative  Nitrite (UA) Negative  Leukocyte Esterase (UA) 1+ (Result(s) reported on 06 Feb 2013 at 01:39AM.)  RBC (UA) <1 /HPF  WBC (UA) 9 /HPF  Bacteria (UA) TRACE  Epithelial Cells (UA) 1 /HPF (Result(s) reported on 06 Feb 2013 at 01:39AM.)  Routine Hem:  23-Feb-14 11:03   WBC (CBC)  13.1  RBC (CBC)  1.58  Hemoglobin (CBC)  5.2  Hematocrit (CBC)  15.6  Platelet Count (CBC)  118 (Result(s) reported on 07 Feb 2013 at 12:01PM.)  MCV 99  MCH 33.0  MCHC 33.4  RDW  16.7  Bands 13  Segmented Neutrophils 61  Lymphocytes 10  Monocytes 16  Diff Comment 1 ANISOCYTOSIS  Diff Comment 2 NORMAL PLT MORPHOLGY  Result(s) reported on 07 Feb 2013 at 12:01PM.  24-Feb-14 05:13   WBC (CBC) 10.8  RBC (CBC)  2.35  Hemoglobin (CBC)  7.6  Hematocrit (CBC)  22.0  Platelet Count (CBC)  85  MCV 93  MCH  32.2  MCHC 34.5  RDW  17.5  Neutrophil % 68.3  Lymphocyte % 9.3  Monocyte % 22.3  Eosinophil % 0.0  Basophil % 0.1  Neutrophil #  7.4  Lymphocyte # 1.0  Monocyte #  2.4  Eosinophil # 0.0  Basophil # 0.0   [place]    23-Feb-14 15:20, [place] Bilateral  [place] Bilateral   REASON FOR EXAM:    ARF over CKD  COMMENTS:       PROCEDURE: [place] - [place]  -  Feb 07 2013  3:20PM     RESULT: The study was limited due to the patient's clinical condition.   The left kidney could not be visualized. The right kidney is smaller   measures 5.4 x 3.4 x 3.7. There is no hydronephrosis. The echotexture of   the renal cortex is markedly increased consistent with medical renal   disease.    IMPRESSION:  The patient's left kidney could not be assessed due to the   patient's clinical condition as well as a considerable amount of gas   present. The right kidney is small, echogenic, and demonstrates no   evidence of obstruction.   Dictation Site: 5        Verified By: DAVID A. Martinique, M.D., MD    Impression 69yo Caucasian woman s/p fall with BL humeral fractures who is seen for acute on chronic kidney disease.   Plan **AoCKD:  no indications for dialysis at this time but she is certainly at risk for developing them during the hospitalization, particularly if she should need to undergo sugery with risk for intraoperative hypotension.  Discussed with patient and husband that though their preference is PD at home, should she need dialysis during this admission it would need to be hemodialysis.  --Continue MIVF until she can take po intake, rec NS @ 75/hr --Agree with foley for now --Check urine culture given admission UA, leukocytosis(ordered) --Avoid contrast, NSAIDs as you are  **Metabolic acidosis:  secondary to CKD with bicarb 19 --cont oral bicarb as able, no indication to give IV bicarb if can't take po   **HTN:  normotensive  **Anemia:  defer w/u to hospitalist but agree with  transfusion given Hb drop.  Will follow and may need to start epogen soon.  Will continue to follow closely, please page me with any questions or concerns.  Leanora Cover pager [phone]   Electronic Signatures: Justin Mend (MD)  (Signed 25-Feb-14 10:34)  Authored: Health Issues, General Aspect/Present Illness, Home Medications, Allergies, History and Physical Exam, Vital Signs, Labs, Radiology, Impression/Plan   Last Updated: 25-Feb-14 10:34 by Justin Mend (MD)

## 2015-04-07 NOTE — Consult Note (Signed)
PATIENT NAME:  Deanna Mcintyre, Deanna Mcintyre MR#:  [id number] DATE OF BIRTH:  October 12, 1946  DATE OF CONSULTATION:  02/06/2013  REFERRING PHYSICIAN:   CONSULTING PHYSICIAN:  Mali E. Aviya Jarvie, MD  REASON FOR CONSULTATION: Bilateral shoulder pain.   HISTORY OF PRESENT ILLNESS: This is a 69 year old female, who fell on bilateral stretched arms and sustained injury to both shoulders. She was seen and evaluated in the Emergency Room and admitted for possible rehabilitation placement as well as pain control. She had previously been undergoing physical therapy for gait training in an outpatient manner. The patient complained of pain in both shoulders rated as a 7 out of 10, sharp in nature, exacerbated by attempted movement of the shoulders and relieved by rest.   PAST MEDICAL HISTORY: Hypertension, CKD, anemia and breast cancer.  PAST SURGICAL HISTORY: Right breast lumpectomy, left total knee replacement and hernia repair.   ALLERGIES: LITHIUM.  SOCIAL HISTORY: The patient denies alcohol or tobacco use.   FAMILY HISTORY: Noncontributory.   MEDICATIONS: Vitamin D, trazodone, propanolol, omeprazole, divalproex sodium, cholecalciferol and aspirin.   REVIEW OF SYSTEMS: The patient denies any fevers or chills. No chest pain. No shortness of breath.  PHYSICAL EXAMINATION:   GENERAL: The patient is well-appearing, well-nourished in no acute distress.  EXTREMITIES: Examination of the bilateral upper extremities reveals tenderness to palpation diffusely about the shoulder. She has significant ecchymosis particularly along the deltopectoral region. She has limited motion of the humerus secondary to pain, although she does have full range of motion of the elbows, wrists and digits. She has 5 out of 5 strength on her wrist flexors and extensors, finger flexors, finger extensors and finger abductor's. She has intact sensation along the axillary, median, radial and ulnar nerve distributions. No gross instability. Good capillary  refill.   IMAGES: X-rays of the bilateral shoulders were obtained and reviewed, which reveals evidence of bilateral proximal humerus fractures with comminution and fairly displaced greater tuberosity fragment on the right.   IMPRESSION: A 69 year old female with bilateral proximal humerus fractures.   PLAN: The patient will be placed into bilateral slings and be made nonweightbearing at this time. She may require operative intervention, but this would be after the soft tissue envelope is more appropriate. The ecchymosis and edema would prevent any intervention at this point. The patient can be followed up in the orthopedic clinic where further evaluation can be completed and if surgery is warranted, this can be scheduled in an elective manner.     ____________________________ Mali E. Jarek Longton, MD ces:aw D: 02/07/2013 11:04:46 ET T: 02/07/2013 11:45:31 ET JOB#: [id number]  cc: Mali E. Isaish Alemu, MD, <Dictator> Mali E Crystian Frith MD ELECTRONICALLY SIGNED 02/07/2013 21:03

## 2015-04-07 NOTE — Consult Note (Signed)
Brief Consult Note: Diagnosis: Bipolar affective disorder most recent episode manic in remission.   Patient was seen by consultant.   Consult note dictated.   Recommend further assessment or treatment.   Orders entered.   Comments: Ms. Duncombe has a h/o biolar illness. She has been maintained on a combination od Depakote 1500 mg/day and Cymbalta 30 mg/day. She is admitted for fractured arms. She has been given depakote 1000 mg/day and no Cymbalta here. She has a h/o falls and confusion. She has been confused in the hospital as well.   We were able to r/o hyperammonemia from depakote treatment.This is good news as the patient has a h/o of difficult to treat bipolar with myultiple >12 manic episodes. Her depakote was discontinued altoghether by one of the consultants. Imaging studies suggest vascular dementia and NPH?.  MSE: The patient continues to improve. She was able to hold a conversation. She insisted that it was Friday.   PLAN: 1. Bipolar: We will restart liquid depakene.   2. Multifactorial delirium. Supportive treatment. I will add low dose of antipsychotic.   3.  I will follow along.  Electronic Signatures: Orson Slick (MD)  (Signed 28-Feb-14 00:02)  Authored: Brief Consult Note   Last Updated: 28-Feb-14 00:02 by Orson Slick (MD)

## 2015-04-07 NOTE — Consult Note (Signed)
Chief Complaint:  Subjective/Chief Complaint Doing much better over the past 48hrs.  Mental status has significantly improved with correction of serum sodium and resolution of fever on ceftriaxone.  No clear source of infection per cultures.    2u pRBC ordered for Hb 6.8 with no obvious source of bleeding identified.  She remains on D5AW 75/hr.  She admits to thirst but otherwise has no complaints.  Her husband is at the bedside and said she's been drinking a lot of fluids.  Per notes plan for her to stay through at least Monday.   VITAL SIGNS/ANCILLARY NOTES: **Vital Signs.:   28-Feb-14 04:37  Vital Signs Type Routine  Temperature Temperature (F) 98.7  Celsius 37  Temperature Source oral  Pulse Pulse 104  Respirations Respirations 18  Systolic BP Systolic BP 644  Diastolic BP (mmHg) Diastolic BP (mmHg) 66  Mean BP 79  Pulse Ox % Pulse Ox % 95  Pulse Ox Activity Level  At rest  Oxygen Delivery Room Air/ 21 %  *Intake and Output.:   Daily 28-Feb-14 07:00  Grand Totals Intake:  2796 Output:  2550    Net:  246 24 Hr.:  246  Oral Intake      In:  240  IV (Primary)      In:  2556  Urine ml     Out:  2550  Length of Stay Totals Intake:  7399 Output:  13525    Net:  -6126   Brief Assessment:  Additional Physical Exam Gen: lying at 45 degrees in bed comfortably, awake Eyes: anicteric ENT: MM tacky Neck : no JVD CV: RRR Lungs: clear anteriorly and laterally, normal WOB Abd: soft, nontender Extr: +UE edema stable, no LE edema GU: foley draining clear urine Neurologic:  alert, conversant, speech unchanged   Lab Results:  Routine Chem:  28-Feb-14 04:23   Sodium, Serum  150  Result Comment LABS - This specimen was collected through an   - indwelling catheter or arterial line.  - A minimum of 60ms of blood was wasted prior    - to collecting the sample.  Interpret  - results with caution.  Result(s) reported on 12 Feb 2013 at 05:08AM.  Glucose, Serum  116  BUN  78   Creatinine (comp)  3.54  Potassium, Serum 4.8  Chloride, Serum  122  CO2, Serum 23  Calcium (Total), Serum 9.1  Anion Gap  5  Osmolality (calc) 322  eGFR (African American)  15  eGFR (Non-African American)  13 (eGFR values <674mmin/1.73 m2 may be an indication of chronic kidney disease (CKD). Calculated eGFR is useful in patients with stable renal function. The eGFR calculation will not be reliable in acutely ill patients when serum creatinine is changing rapidly. It is not useful in  patients on dialysis. The eGFR calculation may not be applicable to patients at the low and high extremes of body sizes, pregnant women, and vegetarians.)    08:42   Sodium, Serum  147 (Result(s) reported on 12 Feb 2013 at 09:03AM.)  Routine Hem:  28-Feb-14 04:23   WBC (CBC) 4.2  RBC (CBC)  2.14  Hemoglobin (CBC)  6.8  Hematocrit (CBC)  20.9  Platelet Count (CBC)  148  MCV 98  MCH 31.9  MCHC 32.6  RDW  16.8  Neutrophil % 54.5  Lymphocyte % 26.5  Monocyte % 17.6  Eosinophil % 1.1  Basophil % 0.3  Neutrophil # 2.3  Lymphocyte # 1.1  Monocyte # 0.7  Eosinophil #  0.0  Basophil # 0.0   Assessment/Plan:  Assessment/Plan:  Assessment 43F with multiple medical problems who is seen for AoCKD in setting of BL humeral fractures.   Plan **AoCKD:  Renal function is back to baseline and she has great UOP.   --Back to (recent) baseline Cr 3.5 --Foley remains in place, would recommend to consider removal.  If strict I/Os cannot be measured with foley out due to incontinence, it's acceptable given her renal function has improved --Check post void residuals q6h x 24h after foley removed to ensure no urinary retention --If discharged from hospital to rehab/SNF please have chemistries and CBC rechecked within the week and faxed to [facility] [place] fax [phone] -- Continue to avoid contrast, NSAIDs as you are  **Hypernatremia:  Secondary to poor intake with AMS on admission and increased  insensible losses with fever.  Now improved with free water administration.  --Would attempt holding D5W and allowing patient to drink to thirst.  Given her immobility with her arm fractures this may be a problem and she may need an order for a minimum amount of oral water each day to ensure she remains normonatremic.  (I.e. order for 1574m water intake per day).  **Metabolic acidosis:  resolved, no indications for bicarb supplement.  Order discontinued.   **HTN:  normotensive  **Anemia:  Multifactorial with anemia of CKD and acute blood loss. Improved s/p transfusion but again 6.8 without source for blood loss. Defer further w/u to hospitalist.  Gave epogen 0.5u/kg 02/09/2013.  Will continue to follow closely, please page me with any questions or concerns.  LLeanora Coverpager [phone]  After today the covering nephrologist is Dr. RTrinda Pascal   Electronic Signatures: KJustin Mend(MD)  (Signed 28-Feb-14 13:55)  Authored: Chief Complaint, VITAL SIGNS/ANCILLARY NOTES, Brief Assessment, Lab Results, Assessment/Plan   Last Updated: 28-Feb-14 13:55 by KJustin Mend(MD)

## 2015-04-07 NOTE — Consult Note (Signed)
Pt in u/s.  Full consult to be done tomorrow.  Electronic Signatures: Blocker MPH, Heinz Knuckles (MD)  (Signed on 03-Mar-14 15:25)  Authored  Last Updated: 03-Mar-14 15:25 by Blocker MPH, Heinz Knuckles (MD)
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
## 2015-04-07 NOTE — Consult Note (Signed)
PATIENT NAME:  Deanna Mcintyre, Deanna Mcintyre MR#:  [id number] DATE OF BIRTH:  [birth year]  DATE OF CONSULTATION:  02/16/2013  REFERRING PHYSICIAN:  Dr. Tressia Miners.  CONSULTING PHYSICIAN:  Heinz Knuckles. Blocker, MD  REASON FOR CONSULTATION:  Fever.   HISTORY OF PRESENT ILLNESS:  The patient is a 69 year old female with a past history significant for chronic renal insufficiency and multiple falls who was admitted on 02/21, after falling at the supermarket and developing bilateral humeral fractures. She was admitted and felt that surgery was not indicated. She has been kept in slings bilaterally. This has kept her in bed, and she has not been ambulatory. She denies any fevers, chills or sweats prior to her admission; however, she began having some low-grade fever on February 23rd with a temperature of 100.2. On February 24th, her temperature was 100.3, and then on February 25th, she went up to 101.4. Most of the intervening temperatures prior to that time were in the normal range. On February 26th, she had a temperature of 102, and then on March 2nd, she had a temperature of 102.6. Her white count during her hospitalization has been normal. Blood cultures taken on admission were unremarkable. A urine culture on admission was negative. Repeat blood cultures from March 2nd are negative. A UA at that time was unremarkable. There is no UA on admission. She had a chest x-ray on March 3rd, which showed atelectasis but no obvious infiltrates. A chest x-ray on admission showed no abnormalities other than the bilateral humerus fractures. X-rays on February 23rd were also unremarkable. She has had venous Dopplers, which have shown no evidence for DVT.  Her family states that she was confused at the time that she had the high fever, but is back to her baseline now. She has some headache now, but no particular sinus congestion. No sore throat. No cough. No shortness of breath. No sputum production. She denies any GI symptoms. She has had  no GU symptoms.  ALLERGIES:  INCLUDE LITHIUM, WHICH CAUSED RENAL INSUFFICIENCY.   PAST MEDICAL HISTORY: 1.  Chronic renal insufficiency.  2.  Hypertension.  3.  Breast cancer status post right lumpectomy.  4.  Bipolar disorder.  5.  Overactive bladder.  6.  Status post knee replacements.  7.  Status post resection of an acoustic neuroma.  8.  Status post hernia repair.   SOCIAL HISTORY:  The patient lives at home with family. She smokes less than a pack of cigarettes per day. No pets at home.   FAMILY HISTORY:  Denied any significant family history.     REVIEW OF SYSTEMS:   GENERAL:  No fevers prior to admission, but she has had a fever in the hospital. No chills or sweats. Positive generalized malaise.  HEENT:  Positive headaches. No sinus congestion. No sore throat.  NECK:  No stiffness. No swollen glands.  RESPIRATORY:  No cough. No shortness of breath. No sputum production.  CARDIAC:  No chest pains or palpitations.  GASTROINTESTINAL:  No nausea. No vomiting. No abdominal pain. No change in her bowels.  GENITOURINARY:  She has no dysuria. No increased frequency. She has had a Foley catheter in place since admission.  MUSCULOSKELETAL:  She has multiple areas of discomfort but no frank arthritis. He has got pain in bilateral shoulders related to her recent fall and fractures.  SKIN:  She has got multiple bruises, but no other rashes.  NEUROLOGIC:  She was confused with her fever but is improving. All other systems were negative.  PHYSICAL EXAMINATION: VITAL SIGNS:  T-max of 102.6, current temperature of 98.8, pulse of 100, blood pressure 108/60, 93% on room air.  GENERAL:  A 68 year old obese white female, in no acute distress, slumped over to the left in the bed.  HEENT:  Normocephalic, atraumatic. Pupils equal and reactive to light. Extraocular motion appeared to be intact. Sclerae, conjunctivae and lids do not appear to have any emboli or petechiae.  Oropharynx: Fair dentition,  normal lips.  NECK:  Midline trachea. No lymphadenopathy. No thyromegaly.  CHEST:  Clear to auscultation bilaterally. Good air movement. No focal consolidation.  HEART:  Regular rate and rhythm without murmur, rub or gallop.  ABDOMEN:  Soft, nontender, nondistended. No hepatosplenomegaly. No hernia is noted.  EXTREMITIES:  Both upper extremities were held in splints. She had multiple ecchymoses over the upper arms bilaterally. There was no evidence for tenosynovitis.   SKIN:  No rashes. No stigmata of endocarditis, specifically no Janeway lesions or Osler nodes.  NEUROLOGIC:  The patient was awake and interactive, but her affect was somewhat flat. She was drooling onto paper towels and was slumped over to the left, but was able to answer questions fairly appropriately.  PSYCHIATRIC:  Mood and affect appeared normal, other than the flat affect.   LABORATORY, DIAGNOSTIC AND RADIOLOGICAL DATA:  Shows a BUN of 80, creatinine 3.03, bicarbonate 25, anion gap of 5. LFTs were unremarkable. TSH was 0.685. White count from yesterday was 8.6 with a hemoglobin of 8.8, platelet count of 248, ANC of 5.9. White count on admission was 11.4, but came down to normal within 2 days. Blood cultures from 03/02 show no growth. A urinalysis from 03/02 had negative nitrites, negative leukocyte esterase, 2 red cells, 1 white cell per high-powered field. Urine culture from 02/26 was negative. Blood cultures from 02/25 were negative. Urine culture from 02/24 is negative.   Urinalysis from admission had negative nitrites, 1+ leukocyte esterase, less than 1 red cell  and 9 white cells per high-powered field. A chest x-ray from admission showed no infiltrates. Chest x-ray from 02/23 showed no infiltrates. CT scan without contrast of the head showed no acute intracranial process. An MRI of the brain without contrast showed no acute abnormality. Chest x-ray from yesterday showed hypoinflation with left lung atelectasis versus infiltrate.  Bilateral lower extremities showed no evidence for DVT.   IMPRESSION:  A 69 year old female with a history of chronic renal insufficiency and multiple falls, admitted with bilateral humerus fractures, who has developed fever.   RECOMMENDATIONS: 1.  She had negative blood cultures, and her UA does not show significant inflammation. Her white count is normal. She has a Foley and a triple lumen in place. Venous Dopplers were negative. So far, there is no obvious infectious etiology for her fever. We will continue the ceftriaxone for another day, then stop if the blood cultures remain negative.  2.  If she spikes again, would consider removing the triple lumen catheter.  3.  We will give her an incentive spirometer. She has been in bed since her admission and may have atelectasis. Her chest x-ray is not very impressive for infiltrate. She had a long bone fracture, and the possibility of a fat emboli should be considered. She has no respiratory symptoms, and the fracture appears to be a simple fracture. This was a moderately complex infectious disease consult.    Thank you very much for involving me in the patient's care.   ____________________________ Heinz Knuckles. Blocker, MD meb:dmm D: 02/16/2013 12:06:00  ET T: 02/16/2013 12:38:04 ET JOB#: [id number]  cc: Heinz Knuckles. Blocker, MD, <Dictator> MICHAEL E BLOCKER MD ELECTRONICALLY SIGNED 02/16/2013 14:12

## 2015-04-07 NOTE — Consult Note (Signed)
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 She also has nephrogenic DI. I last saw her at [facility] in the setting of bilateral humeral fractures. She was discharged to a LTAC and was at [facility]. She presented to the [facility] ED last week with anemia. She was discharged from the ED. At that time, her Cr was 3.1 and Na 146 (6/18). She now presents with altered mental status, hypernatremia (Na 154) and AoCKD (Cr 3.5). Her WBC is also elevated. Her Temp is currently 98.9. She had a LP, Head CT, CXR in eval. Also her U/A shows 1+LE, 2+ bld and 1+ bacteria. Her Hgb is 6.8. Palliative care saw the patient and spoke with the family. The patient is now DNR/DNI. Family is not present at the bedside currently.  I am unable to elicit a ROS or history from the patient at the present time. She is currently sitting in bed being fed by the nurse.  PMH: CKD4 lithium exposure bipolar bil humeral fracture nephrogenic DI anemia  ALL: lithium SH: h/o smoking FH: not obtained  EXAM: 98.9 104 20 100/69 [id number] Gen: elderly woman, appears older than stated age, sitting in bed Lungs: difficult to assess but appear clear ant CV: no rub, RRR Abd: soft, distended Ext: pitting edema to knees Neuro: not answering questions, tracks with eyes  LABS: 06/09/13 Na 154, K 5.1, C02 17, Cr 3.5, BUN 90 Ca 11.0 Alb 1.8 Hgb 6.8 WBC 22.6 HEAD CT/CXR reviewed  A/P: 68 year old woman with CKD4 and now with altered mental status, hypernatremia, AKI, elevated WBC  1) Acute Hypernatremia- unclear what precipitated the acute event ?infection ?urinary source.For now, supportive care. Her free water deficit is 2.7 L. Would give her 100 cc/hr of D5W and reassess Na level in 4 hours ensure that Na level is improving. Goal Na is 146 by 6am tomorrow. She has nephrogenic DI and needs to maintain free water intake. Her Na level  was 146 on 6/18 (one week ago)  2) AoCKD- no acute indications for dialysis. Unfortunately she has not rebounded since her humeral fractures and per notes, she is bed bound. Would continue supportive care for now (correct hypernatremia/look for source of infection/treat infection) and hopefully she will get better. Goals of care can be addressed with husband and children on a daily basis.  3) Anemia- Hgb 6.8. Would recommend transfusion. We can give her Epogen as well in the next day. Likely related to her kidney disease. No obvious source of bleeding. Monitor closely.  4) Acidosis- reassess in AM. Given her poor mental status, will minimize oral tabs and will not start sodium bicarb for now. If mental status improves and remains with metabolic acidosis, would start sodium bicarb.  5) Delirium- likely from infection and hypernatremia and volume depletion.  6) Hypercalcemia- suspect this is from her volume depletion +/- effect of lithium.  Will try to speak with family when they are available tomorrow to further elicit goals of care.  Electronic Signatures: Trinda Pascal (MD)  (Signed on 25-Jun-14 18:08)  Authored  Last Updated: 25-Jun-14 18:08 by Trinda Pascal (MD)

## 2015-04-07 NOTE — Consult Note (Signed)
Brief Consult Note: Diagnosis: B prox humerus fx.   Patient was seen by consultant.   Consult note dictated.   Comments: NWB BUE with slings no operative intervention at this time may need ORIF R prox humerus due to displacement of greater tuberosity will await healing of soft tissues and decreased ecchymosis prior to any intervention, 1-2 wks.  Electronic Signatures: Jayke Caul, [place] Edward (MD)  (Signed 22-Feb-14 08:19)  Authored: Brief Consult Note   Last Updated: 22-Feb-14 08:19 by Devonta Blanford, [place] Edward (MD)

## 2015-04-07 NOTE — Consult Note (Signed)
Chief Complaint and History:  Referring Physician Dr. 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 She has had a prolonged hospital course. Records were reviewed, patient was interviewed and examined and her hsband and daughter at the bedside were interviewed. On presentation, she had acuted worsening of chronic kidney disease with a creatinine of 3.9 up from her recent baseline of about 3. Creatinine increased to 4.8 on 2/23 at which time IVF with 0.9% NS was givenn. Creatinine on 2/24 was 4.4 and sodium was normal at 144. On 2/26, creatinine remained 4.4 and sodium was >160. Fluids were changed to D5W and sodium improved to the 150s. She has since been changed to D50.2%NS at 75 cc/hr and serum sodium is 145 today. Urine output has been 2.5-3.6 L/day since 2/25. Serum osm has been in the 313-323 range. Urine sodium on 3/2 was 40. Patient is eating less than 50% of meal trays. She is on a thickened liquid diet and is at aspiration risk. She is not able to provide a history. She admits to being thirst but declines to drink at this time. Family has been encouraging PO intake of liquids. No N/V/diarrhea.   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, overactive bladder  SURGICAL HX s/p BL knee replacements, s/p R lumpectomy, s/p hernia repair, s/p BTL   Allergies:  Lithium: Other  Home Medications: Medication Instructions Status  Aspirin Enteric Coated 81 mg oral delayed release tablet 1 tab(s) orally once a day (in the morning) Active  Cymbalta 30 mg oral delayed release capsule 1 cap(s) orally once a day (in the morning)  Active  propranolol 60 mg oral capsule, extended release 1 cap(s) orally once a day (in the morning) Active  sodium bicarbonate 650 mg oral tablet 1 tab(s) orally once a day Active  Tears Naturale II 2 drop(s) to each affected eye 4 times a day as needed. Active  trazodone 50 mg oral tablet 1 tab(s) orally once a day (at bedtime) Active  Vitamin D3 1000 intl units oral capsule 1 cap(s) orally once a day (in the morning) Active  divalproex sodium 250 mg oral delayed release tablet 2 tabs (518m) orally once a day (in the morning), and 4 tabs (10038m orally once a day (at bedtime). Active   Past Family Social History:  Family History Non-Contributory   Family/Social Hx. married   Home Environment lives with family   Review of Systems:  Review of Systems   EXT: diffuse swellingdisorientedNo weight loss, +feversDenies chest pain or palpitationsno sob, + cough, +chest congestionAppetite is fair, no N/VFoley in place, no hematuriaNo heat/cold intolerance  Physical Exam:  General Elderly WF in NAD   Skin No visualized rash   Eyes EOMI   ENMT OP clear   Head and Neck Neck supple, no thyromegaly   Respiratory and Thorax Clear anteriorly b/l, no wheeze   Cardiovascular RRR   Gastrointestinal Soft, NT/ND   Genitourinary Foley catheter, urine is light in color   Musculoskeletal +edema, non-pitting   Psychiatric Alert to person, place, not time (not aware of year or month), calm & cooperative   VITAL SIGNS/ANCILLARY NOTES: **Vital Signs.:   03-Mar-14 08:49  Vital Signs Type Routine  Temperature Temperature (F) 100.8  Celsius 38.2  Temperature Source oral  Pulse Pulse 115  Respirations Respirations 18  Systolic BP Systolic BP 800  Diastolic BP (mmHg) Diastolic BP (mmHg) 80  Mean BP 98  Pulse Ox % Pulse Ox % 92  Pulse Ox Activity Level  At rest  Oxygen Delivery Room Air/ 21 %    10:13  Vital Signs Type Post Medication  Temperature Temperature (F) 100.6  Celsius 38.1   Temperature Source oral   Laboratory Results: Routine Chem:  03-Mar-14 02:53   Glucose, Serum  115  BUN  79  Creatinine (comp)  3.15  Sodium, Serum 145  Potassium, Serum 5.1  Chloride, Serum  116  CO2, Serum 23  Calcium (Total), Serum 9.3  Anion Gap  6  Osmolality (calc) 313  eGFR (African American)  17  eGFR (Non-African American)  15 (eGFR values <40m/min/1.73 m2 may be an indication of chronic kidney disease (CKD). Calculated eGFR is useful in patients with stable renal function. The eGFR calculation will not be reliable in acutely ill patients when serum creatinine is changing rapidly. It is not useful in  patients on dialysis. The eGFR calculation may not be applicable to patients at the low and high extremes of body sizes, pregnant women, and vegetarians.)  Result Comment LABS - This specimen was collected through an   - indwelling catheter or arterial line.  - A minimum of 550m of blood was wasted prior    - to collecting the sample.  Interpret  - results with caution.  Result(s) reported on 15 Feb 2013 at 04:34AM.  Routine Hem:  03-Mar-14 02:53   WBC (CBC) 8.6  RBC (CBC)  2.91  Hemoglobin (CBC)  8.8  Hematocrit (CBC)  27.5  Platelet Count (CBC) 248  MCV 94  MCH 30.3  MCHC 32.1  RDW  16.6  Neutrophil % 67.9  Lymphocyte % 13.3  Monocyte % 16.9  Eosinophil % 1.2  Basophil % 0.7  Neutrophil # 5.9  Lymphocyte # 1.2  Monocyte #  1.5  Eosinophil # 0.1  Basophil # 0.1 (Result(s) reported on 15 Feb 2013 at 12:06PM.)   Assessment/Plan:  Orders/Results reviewed Orders reviewed and updated, Laboratory results reviewed, Vital Signs reviewed, Nursing documentation reviewed   Assessment/Plan [age]o F with h/o stage IV CKD admitted after a fall when sufferred b/l humeral fractures, who has had a prolonged hosp course. Now with recurrent fevers, persistent hypernatremia and polyuria. 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 Will confirm with checking urine Osm, which is expected to be low. Will then give a one time dose of IV desmopressin 2 mcg. Then will repeat a urine Osm. If she has partial nephrogenic DI, it may increase some but likely will not normalize. Will also request Nephrology input on the eval and treatment of nephrogenic DI.  Thank you for the kind request ofr consultation. Will follow up on labs, once completed.   Electronic Signatures: SoJudi CongMD)  (Signed 03-Mar-14 16:33)  Authored: Chief Complaint and History, ALLERGIES, HOME MEDICATIONS, PFSH, Review of Systems, Physcial Exam, Vital Signs, LABS, Assessment/Plan   Last Updated: 03-Mar-14 16:33 by SoJudi CongMD)

## 2015-04-07 NOTE — H&P (Signed)
DATE OF BIRTH:  01/04/1946  DATE OF ADMISSION:  02/05/2013  PRIMARY CARE PHYSICIAN:  Dr. Silvio Pate  Refering Physician: Dr. Ulice Brilliant.  CHIEF COMPLAINT: Fall today.   HISTORY OF PRESENT ILLNESS: A 69 year old Caucasian female with a history of hypertension, CKD, anemia, bronchitis, breast cancer, left ear deaf, presented to the ED with a fall. The patient is alert, awake, oriented, in no acute distress. According to patient and her husband, patient underwent physical therapy in the hospital today and then went to a grocery store. The patient walked very fast and fell by accident, with her face down to the ground. The patient was then sent to the ED for further evaluation. The patient was noted to have bilateral humeral fractures. Since patient cannot walk due to a balance problem with bilateral humeral fracture,. Dr. Ulice Brilliant.discussed with the case manager, patient will be admitted for bilateral humeral fracture, and will search for rehab placement. The patient denies any fever or chills. No headache or dizziness. No loss of consciousness or seizure. She has had chronic incontinence a long time.  PAST MEDICAL HISTORY:  Hypertension, CKD, anemia, bronchitis, breast cancer, status post right side lumpectomy, left ear deaf, bipolar disorder, overactive bladder.  SURGICAL HISTORY: Right breast lumpectomy, left bilateral knee replacement, removal of acoustic neuroma, ovary cancer,  breast biopsy, hernia repair, tubal ligation.   ALLERGIES:  Lithium HOME MEDICATIONS:   1.  Vitamin D3, 1000 mg p.o. daily.  2.  Trazodone 50 mg p.o. at bedtime.  3.  Tears Naturale II, 1  drop both eyes q.i.d.  4.  Sodium bicarb 650 mg p.o. b.i.d.  5.  Propranolol 60 mg p.o. daily.  6.  Omeprazole 20 mg p.o. daily.  7. Divalproex sodium 500 mg p.o. in the morning and 1000 mg p.o. in the evening.  8.  Cholecalciferol 1000 units p.o. daily.  9.  Aspirin 325 mg p.o. daily.   REVIEW OF SYSTEMS:   CONSTITUTIONAL: The  patient denies any fever or chills. No headache or dizziness. No loss of consciousness No weakness.  EYES: No double vision or blurred vision.  EARS, NOSE, THROAT:   No postnasal drip, slurred speech, or dysphagia, but has left ear deaf.  CARDIOVASCULAR: No chest pain, palpitations, orthopnea, nocturnal dyspnea. No leg edema.  PULMONARY: No cough, sputum, shortness of breath or hematemesis.  GASTROINTESTINAL: No abdominal pain, nausea, vomiting or diarrhea. No melena or bloody stool. GENITOURINARY:   No dysuria, hematuria, but has  chronic incontinence.  SKIN:  No rash or jaundice.  NEUROLOGIC:  No syncope, loss of consciousness or seizure.  MUSCULOSKELETAL:  Bilateral shoulder pain. No edema.  HEMATOLOGIC:  No easy bruising or bleeding.  ENDOCRINE:  No heat or cold intolerance. No polyuria or polydipsia.   VITAL SIGNS: Temperature 97.5, blood pressure 126/79, pulse 71, respirations 20, O2 saturation 95% on room air.   PHYSICAL EXAMINATION:  GENERAL:  The patient is alert, awake, oriented, in no acute distress.  HEENT: Pupils round, equal, reactive to light and accommodation. Moist oral mucosa. Clear oropharynx.bruise on the right side of the lower lip. NECK: Supple. No JVD or carotid bruit. There is lymphadenopathy on the left side about 1 cm.  No tenderness. No JVD. No carotid bruit.  CARDIOVASCULAR:  S1, S2. Regular rate and rhythm. No murmurs or gallops.  PULMONARY: Bilateral air entry. No wheezing or rales. No use of accessory muscles to breathe.  ABDOMEN:  Soft. No distention or tenderness. No organomegaly. Bowel sounds present.  EXTREMITIES:  No edema,  clubbing or cyanosis. No calf tenderness, but patient cannot move bilateral upper extremities due to fracture and injury. Some bruises bilateral lower extremities. Pedal pulses present.  SKIN:  No rash or jaundice.  NEUROLOGIC:  Alert and oriented x 3. No focal deficit. Sensation intact.    LABORATORY DATA:  CBC and BMP are pending.  X-ray showed bilateral humeral fracture. CAT scan of head:  No acute abnormality. CAT scan of facial area without contrast showed no evidence of acute abnormality.   IMPRESSIONS: 1.  Bilateral humeral fractures.  2.  Frequent falls.  3.  Chronic kidney disease.  4.  Anemia.  5.  Bipolar disorder.   PLAN OF TREATMENT:  1.  The patient will be admitted to medical floor. Will start fall precautions. Will get a physical therapy evaluation. Discuss with case manager for rehab placement.  2.  Follow up CBC and BMP.  3.  Deep vein thrombosis and gastrointestinal prophylaxis.   Discussed the patient's condition and plan of treatment with patient and patient's husband.   TIME SPENT: About 50 minutes.   ____________________________ Demetrios Loll, MD qc:mr D: 02/05/2013 21:59:00 ET T: 02/05/2013 22:20:46 ET JOB#: [id number]  cc: Demetrios Loll, MD, <Dictator> Demetrios Loll MD ELECTRONICALLY SIGNED 02/07/2013 14:20

## 2015-04-07 NOTE — Discharge Summary (Signed)
PATIENT NAME:  Deanna Mcintyre, NEUNER MR#:  [id number] DATE OF BIRTH:  August 05, 1946  DATE OF ADMISSION:  06/09/2013 DATE OF DISCHARGE:  06/14/2013  ADMITTING PHYSICIAN: Vivien Presto, MD   DISCHARGING PHYSICIAN: Gladstone Lighter, MD   PRIMARY CARE PHYSICIAN: Bernie Covey, MD   CONSULTATIONS IN THE HOSPITAL:  1.  Nephrology consultation by [facility], Dr. Trinda Pascal.  2.  Palliative Care consultation by Dr. Ermalinda Memos.   DISCHARGE DIAGNOSES: 1.  Acute metabolic encephalopathy.  2.  Hypernatremia.  3.  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  4.  Sepsis.  5.  Klebsiella urinary tract infection.  6.  Acute renal failure progressing to chronic kidney disease stage V.  7.  History of chronic kidney disease stage IV prior to admission.  8.  Bipolar disorder.  9.  Hypercalcemia.  MEDICATIONS:  1.  Xanax 0.25 mg p.o. twice a day as needed for anxiety.  2.  Colace 100 mg p.o. b.i.d.  3.  Tylenol 650 mg q. 4 hours p.r.n. for pain or fever.  4.  Valproic acid 250 mg x 5 mL-10 mL 3 times a day with meals.  5.  Risperidone 0.5 mg p.o. b.i.d.  6.  Roxanol 20 mg per mL concentration solution-0.25 to 0.5 mL orally q. 1 to 2 hours as needed for pain or dyspnea.  7.  Ativan 0.5 mg 1 to 2 tablets orally every 2 to 4 hours as needed for agitation and anxiety.   LABORATORY AND IMAGING STUDIES:  Sodium was 146 at the time of discharge, potassium 4.4, chloride 116, bicarbonate 21, BUN 66, creatinine 2.88, glucose of 94 and calcium of 10.6. WBC 15.3, hemoglobin 8.1, hematocrit 25.0, platelet count is 228. Blood cultures negative except 1 set which is growing coagulase-negative staph, which is a contaminant. Urine cultures growing Klebsiella pneumonia, only 10,000 colonies.   BRIEF HOSPITAL COURSE:   Ms. Deanna Mcintyre Is a 69 year old Caucasian female with a past medical history significant for CKD stage IV, recently diagnosed with nephrogenic diabetes insipidus secondary  to prior history of  lithium, history of bipolar disorder with psychotic features, history of breast cancer status post partial mastectomy, ovarian cancer, acoustic neuroma status post surgery with left-sided deafness and facial droop, hypertension, who has been in the hospital several times lately secondary to altered mental status and hypernatremia, comes back with similar complaints.   1.  Acute metabolic encephalopathy secondary to hypernatremia as the patient's sodium was elevated at 154. She was started on D5 and titrated up to 150 mL/h to improve her sodium, which eventually came down but was dependent on D5. However, because of the patient's multiple hospitalizations for the same reason and unable to keep up with the free water to maintain her low sodium, and also worsening renal failure, the family has opted for Hospice at this time.  2.  Chronic kidney disease stage IV progressing to end-stage renal disease secondary to acute renal failure.  Again, poor p.o. intake, was on D50, seen by nephrologist and recommended that she would need dialysis sooner, but however has poor prognosis exam, so the patient's daughter, husband and son talked among themselves and decided against dialysis at this time and agreed for Hospice Home .  3.  Sepsis secondary to Klebsiella urinary tract infection.  White count improving, and she was treated with Rocephin and Keflex while in the hospital.  4.  Bipolar disorder. Her home medications were continued.  5.  The patient has extremely poor  prognosis and appropriate for Hospice Home.  She also has underlying dementia. She is alert and maintains conversation but is only oriented to self at this time.   DISCHARGE CONDITION: Guarded with  poor prognosis.   DISCHARGE DISPOSITION: Hospice Home.   TIME SPENT ON DISCHARGE: 45 minutes.        CODE STATUS: DO NOT RESUSCITATE.   ____________________________ Gladstone Lighter, MD rk:cb D: 06/14/2013 15:57:13  ET T: 06/14/2013 17:21:23 ET JOB#: [id number]  cc: Gladstone Lighter, MD, <Dictator> Valetta Close, MD Gladstone Lighter MD ELECTRONICALLY SIGNED 07/01/2013 7:36

## 2015-04-07 NOTE — Consult Note (Signed)
Brief Consult Note: Diagnosis: Bipolar affective disorder most recent episode manic in remission.   Patient was seen by consultant.   Consult note dictated.   Recommend further assessment or treatment.   Orders entered.   Comments: Ms. Sonier has a h/o biolar illness. She has been maintained on a combination od Depakote 1500 mg/day and Cymbalta 30 mg/day. She is admitted for fractured arms. She has been given depakote 1000 mg/day and no Cymbalta here. She has a h/o falls and confusion. She has been confused in the hospital as well.   We were able to r/o hyperammonemia from depakote treatment.This is good news as the patient has a h/o of difficult to treat bipolar with myultiple >12 manic episodes. Her depakote was discontinued altoghether by one of the consultants. Imaging studies suggest vascular dementia and NPH?.  MSE: The patient looks better today. She recognizes me fro yesterday, knows that I amd a psychiatrist and tries to ansver simple questions. The husband was not present in the room.   PLAN: 1. Bipolar: We will restart liquid depakote.   2. Multifactorial delirium. Supportive treatment. I will add low dose of antipsychotic.   3.  I will follow along.  4.  Electronic Signatures: Orson Slick (MD)  (Signed 26-Feb-14 20:11)  Authored: Brief Consult Note   Last Updated: 26-Feb-14 20:11 by Orson Slick (MD)

## 2015-04-07 NOTE — H&P (Signed)
PATIENT NAME:  Deanna Mcintyre, ESQUEDA MR#:  [id number] DATE OF BIRTH:  February 02, 1946  DATE OF ADMISSION:  06/09/2013  REFERRING PHYSICIAN: Shirley Friar. Michel Santee, MD  PRIMARY CARE PHYSICIAN: Valetta Close, MD  CHIEF COMPLAINT: Brought in for fever, altered mental status.   HISTORY OF PRESENT ILLNESS: The patient is a 69 year old Caucasian female who lives at SNF at [facility]. She is accompanied with her husband, who provides review of systems. The patient currently is altered and unable to provide any history. Apparently, the patient had a hospitalization in late April to early May for hypernatremia, during which she had fevers and work-up was negative. The patient has a history of CKD, nephrogenic diabetes insipidus, recurrent falls and recurrent hypernatremia. This morning she had a fever and last night she was, per husband, more sluggish. Besides that, we have no further information. Here in the ER, she was noted to be confused and had evidence for infection, including fever and leukocytosis and therefore, given her confusion, an LP was attempted. Two of 3 results do not point to an infection, as there is no WBC or neutrophils She has a white count of 22,000 and fever and hypernatremia and therefore, hospitalist service was contacted for further evaluation and management.   PAST MEDICAL HISTORY:  1.  Hypernatremia, nephrogenic diabetes insipidus and CKD, who is followed at [facility] nephrology.  2.  Chronic anemia.  3.  Bilateral humeral fractures.  4.  Chronic recurrent falls.  5.  Bipolar disorder.  6.  History of breast cancer, right breast lumpectomy.  7.  History of acoustic neuroma with left ear deafness. 8.  Overactive bladder.   SOCIAL HISTORY: Resides at nursing home. No smoking, tobacco or alcohol use, but did use tobacco in the past.   FAMILY HISTORY: Unable to obtain and the husband does not know review of systems. Unable to obtain secondary to altered mental status.   ALLERGIES: LITHIUM.    OUTPATIENT MEDICATIONS: Colace 100 mg 2 times a day, Depakote 125 mg sprinkles 4 caps 3 times a day, ferrous sulfate 325 mg 2 times a day, Megace 40 mg/ mL 20 mL 2 times a day, metoprolol 12.5 mg 2 times a day, MiraLAX 17 grams once a day, multivitamin 1 tab daily, Procrit 6000 units on Fridays, Protonix 40 mg daily, Risperdal 0.5 mg 2 times a day, Systane ophthalmic solution 4 times a day (1 drop to each eye), Tylenol 650 mg every 4 hours as needed for pain, vitamin C 1000 mg once a day, Xanax 0.25 mg 1 tab 2 times a day as needed for anxiety.   PHYSICAL EXAMINATION: VITAL SIGNS: Temperature was 101.4 on arrival, pulse 122, respiratory rate 24, blood pressure 113/58, O2 sat 97% on room air.  GENERAL: The patient is an obese Caucasian female lying in bed, chronically ill-appearing, no obvious distress.  HEENT: Normocephalic, atraumatic. Pupils appear to be equal and reactive. Very dry mucous membranes. Does not track. Looks forward. Moves her head slowly. Some dried blood around lips.  NECK: No thyroid tenderness. No JVD. Supple.  LUNGS: Very poor respiratory effort, but no crackles or rales.  CARDIOVASCULAR: S1, S2, regular rate and rhythm. No murmurs appreciated.  ABDOMEN: Soft, nontender, nondistended. Positive bowel sounds in all quadrants. No organomegaly appreciated.  EXTREMITIES: Very slight pitting edema in the feet. SKIN: There are multiple ecchymoses without evidence of cellulitis, mostly in the extremities upper and lower, none on the trunk. No drainage. NEUROLOGIC: Unable to do a full neuro exam, but the  patient moves extremities rather slowly, upper extremities only. Follows very simple commands like opening her mouth. Globally weak.  PSYCHIATRIC: Alert, awake, not oriented.   LABORATORY DATA: BUN 90, creatinine 3.52, sodium 154, potassium 5.1, chloride 125, serum CO2 of 17, GFR 13, calcium 11, albumin 1.8, total bilirubin 0.1, AST 8, ALT 9. Valproic acid 69. WBC 22.6, hemoglobin 6.8,  platelets 266. INR 1.1. CSF from LP as above. Urinalysis shows no nitrites but 1+ leukocyte esterase, 4 RBC, 10 WBC, 1 bacteria. CT of the head without contrast: No evidence of acute ischemic or hemorrhagic infarct, stable moderate ventriculomegaly and diffuse cerebral and cerebellar atrophy, no intracranial mass. Chest portable x-ray, 1 view, shows shallow inspiration, no focal regions of consolidation. EKG: Sinus tach; rate is 110; right bundle branch, Q waves in III and aVF, no acute ST elevations.   ASSESSMENT AND PLAN: We have a 69 year old Caucasian female with chronic kidney disease, likely progressive, currently in stage V; bipolar, nephrogenic diabetes insipidus, chronic anemia, from nursing home with a history of hypernatremia, who presents with altered mental status, sepsis, tachycardia and hypernatremia. The patient will be admitted to the hospital. The patient appears to have multiple lab abnormalities including acute on chronic renal failure and probably urinary tract infection. The patient has been given ceftriaxone and vancomycin and has undergone a lumbar puncture. This is likely metabolic encephalopathy, likely multifactorial, sepsis, hypernatremia, probably intravascular volume depletion as well. We would start her on D5 for the hypernatremia, treat the sepsis with ceftriaxone and wait for the blood and urine cultures, obtain a nephrology consult for the acute on chronic renal failure and monitor the hypernatremia closely by obtaining another BMP later in the afternoon. We would monitor her ins and outs, resume her seizure medicines. The patient does not appear to be having a pneumonia and no evidence of cellulitis. I believe the sepsis, which includes tachycardia, leukocytosis and fever, is likely secondary to urinary tract infection and the patient does have dirty urine. Doubt the patient is having meningitis, as the CSF fluid does not suggest it at this point. Of note, the patient had an  extensive investigation in the last hospitalization for fever and no source was found. The patient appears to have overall a very poor prognosis and this was communicated to the husband, and we will place a palliative care consult. He wants to talk to his kids before he makes a decision. Also make the patient "nothing by mouth" except medications and obtain a swallow evaluation, Would start her on TEDs and sequential compression devices for deep vein thrombosis prophylaxis.   CODE STATUS: The patient is full code.   TOTAL TIME SPENT: 60 minutes.    ____________________________ Vivien Presto, MD sa:jm D: 06/09/2013 11:44:31 ET T: 06/09/2013 12:17:30 ET JOB#: [id number]  cc: Vivien Presto, MD, <Dictator> Vivien Presto MD ELECTRONICALLY SIGNED 06/25/2013 15:03

## 2015-04-07 NOTE — H&P (Signed)
PATIENT NAME:  Deanna Mcintyre, Deanna Mcintyre MR#:  [id number] DATE OF BIRTH:  March 13, 1946  DATE OF ADMISSION:  04/12/2013  PRIMARY CARE PHYSICIAN: Dr. Silvio Pate.  REFERRING PHYSICIAN:  Dr. Silvio Pate.  CHIEF COMPLAINT: Unresponsiveness.   HISTORY OF PRESENT ILLNESS: The patient is a 69 year old Caucasian female with a history of hypernatremia, nephrogenic diabetes insipidus, anemia CKD, was brought to the ED from nursing home due to unresponsiveness today. The patient is unresponsive and unable to provide any information. According to the patient's husband the patient was fine yesterday but was noted to have unresponsiveness early this morning and then was sent to the ED for further evaluation. The patient was noted to have a high sodium at 158 and is being treated with D5 water. According to the patient's husband the patient has no seizure or tremor. The patient was recently discharged to a nursing home. She can communicate and walk. Eating is fine.  PAST MEDICAL HISTORY: Hypernatremia, nephrogenic diabetes insipidus, anemia, CKD, bilateral humeral fractures, chronic recurrent falls, bipolar disorder, breast cancer, acoustic neuroma, left ear deafness, overactive bladder.   SURGICAL HISTORY: Right breast lumpectomy, bilateral knee replacement, removal of acoustic neuroma, ovary cancer, breast biopsy, hernia repair, tubal ligation.   SOCIAL HISTORY: Nursing home resident. No smoking or drinking or illicit drugs.   FAMILY HISTORY: Unable to obtain.  REVIEW OF SYSTEMS: Unable to obtain due to mental status.   ALLERGIES: To LITHIUM.   HOME MEDICATIONS: Colace 100 mg p.o. b.i.d., Depakote 500 mg p.o. t.i.d., fentanyl patch 12 mcg per hour transdermal film, 1 patch transdermal every 72 hours, Lidoderm 5% topical apply 1 patch to back once a day, Megace 800 mg p.o. b.i.d., Lopressor 12.5 mg p.o. b.i.d., MiraLAX 17 grams once a day, multivitamin 1 tablet p.o. daily, Protonix 40 mg p.o. daily, Reglan 5 mg p.o. at meals  and at bedtime, risperidone 0.5 mg p.o. b.i.d., Systane ophthalmic solution, 1 drop each eye 4 times a day, Tylenol 325 mg oral tablets, 2 tablets every 4 hours p.r.n.    PHYSICAL EXAMINATION: VITAL SIGNS: Temperature 97.4, blood pressure 112/87, pulse 80, O2 saturation 98% on room air.  GENERAL: The patient is unresponsive to verbal or painful stimuli, but in no acute distress.  HEENT: Pupils are round, equal and reactive to light. No discharge from ear or nose.  NECK: Supple. No JVD or carotid bruit. No lymphadenopathy. No thyromegaly.  CARDIOVASCULAR: S1 and S2. Regular rate and rhythm. No murmurs or gallops.  PULMONARY: Bilateral air entry. No wheezing or rales. No use of accessory muscles to breathe.  ABDOMEN: Soft. No distention. Bowel sounds present. No organomegaly.  EXTREMITIES: No edema, clubbing, or cyanosis. Bilateral pedal pulses present.  SKIN: No rash or jaundice, but has some bruises  NEUROLOGIC: Patient is unresponsive, and unable to examine at this time.   LABORATORY DATA: Glucose 86, BUN 80, creatinine 3.25, sodium 158, potassium 4.9, chloride 125, osmolality 336, troponin is 0.04.   Urinalysis is negative.   Valproic acid 61.   EKG shows sinus tachycardia, with fusion complexes at 115.   IMPRESSIONS:  1.  Altered mental status, possible metabolic encephalopathy; unclear etiology for now, possibly due to hypernatremia or pain medication.  2.  Hypernatremia.  3.  Nephrogenic diabetes insipidus.  4.  Chronic kidney disease.  5.  Hypertension.  6.  Anemia.   PLAN OF TREATMENT: 1.  The patient will be admitted to the medical floor. We will start aspiration, fall and seizure precautions and keep n.p.o. except meds, and  give D5W IV and follow up BMP.  2.  We will hold fentanyl patch and Lidoderm patch.  3.  We will continue Depakote.   4.  GI and DVT prophylaxis.   Discussed the patient's condition and the plan of treatment with the patient's husband.   THE PATIENT  IS A FULL CODE.   Time spent: About 56 minutes.     ____________________________ Demetrios Loll, MD qc:dm D: 04/12/2013 11:50:12 ET T: 04/12/2013 12:26:37 ET JOB#: [id number]  cc: Demetrios Loll, MD, <Dictator> Demetrios Loll MD ELECTRONICALLY SIGNED 04/12/2013 15:36
